# Patient Record
Sex: Female | Born: 1995 | Race: Black or African American | Hispanic: No | Marital: Single | Smoking: Never smoker
Health system: Southern US, Community
[De-identification: ages and names within clinical notes are randomized; demographics above are authoritative.]

## PROBLEM LIST (undated history)

## (undated) ENCOUNTER — Ambulatory Visit (HOSPITAL_COMMUNITY): Admission: EM | Payer: Medicaid Other | Source: Home / Self Care

## (undated) ENCOUNTER — Inpatient Hospital Stay (HOSPITAL_COMMUNITY): Payer: Self-pay

## (undated) DIAGNOSIS — A630 Anogenital (venereal) warts: Secondary | ICD-10-CM

## (undated) DIAGNOSIS — R87619 Unspecified abnormal cytological findings in specimens from cervix uteri: Secondary | ICD-10-CM

## (undated) DIAGNOSIS — I10 Essential (primary) hypertension: Secondary | ICD-10-CM

## (undated) DIAGNOSIS — O365990 Maternal care for other known or suspected poor fetal growth, unspecified trimester, not applicable or unspecified: Secondary | ICD-10-CM

## (undated) DIAGNOSIS — A749 Chlamydial infection, unspecified: Secondary | ICD-10-CM

## (undated) HISTORY — PX: WISDOM TOOTH EXTRACTION: SHX21

## (undated) HISTORY — DX: Unspecified abnormal cytological findings in specimens from cervix uteri: R87.619

---
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
## 2016-01-02 DIAGNOSIS — N760 Acute vaginitis: Secondary | ICD-10-CM | POA: Insufficient documentation

## 2016-01-02 DIAGNOSIS — B9689 Other specified bacterial agents as the cause of diseases classified elsewhere: Secondary | ICD-10-CM | POA: Insufficient documentation

## 2016-01-15 IMAGING — US US OB COMP +14 WK
1 of 2 series · 12 of 28 positions shown · non-contrast
Comparison: none

[Series 1: us ob comp +14 wk mfm · 12 of 85 slices shown]
[im 1/85]
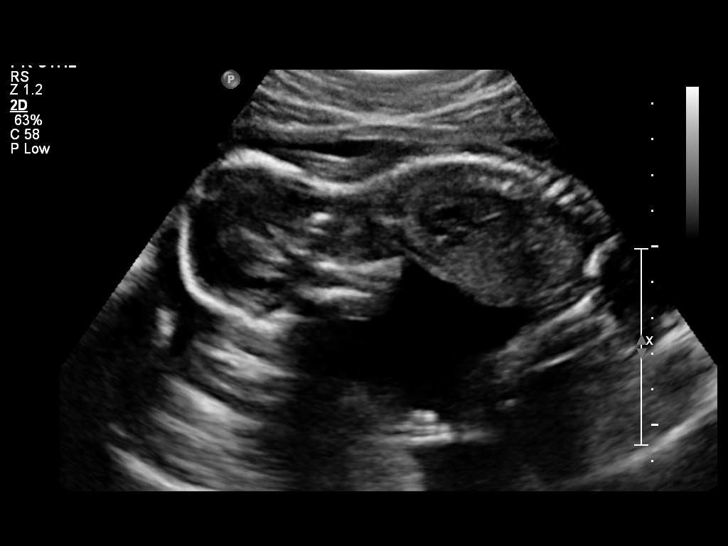
[im 7/85]
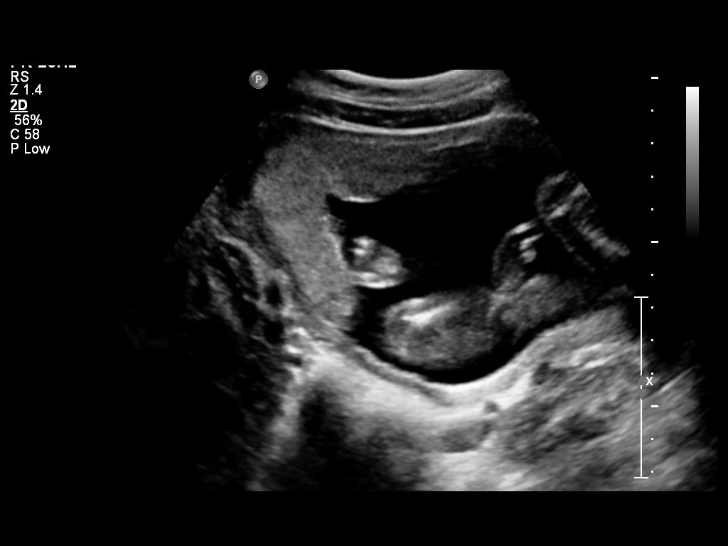
[im 13/85]
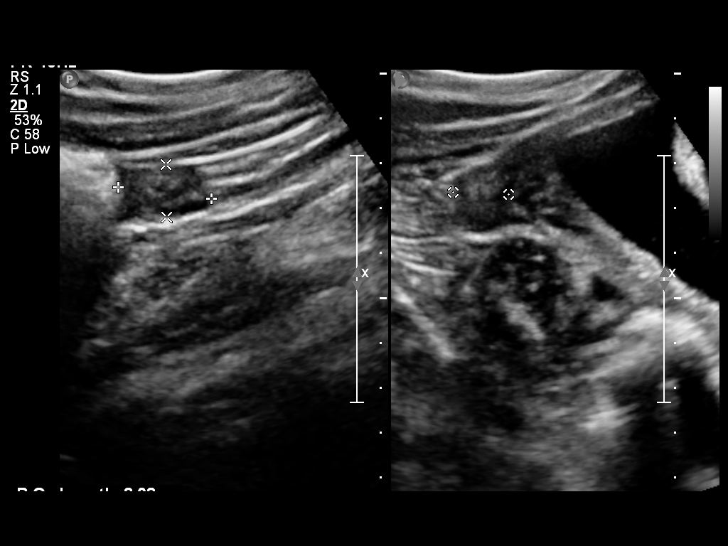
[im 23/85]
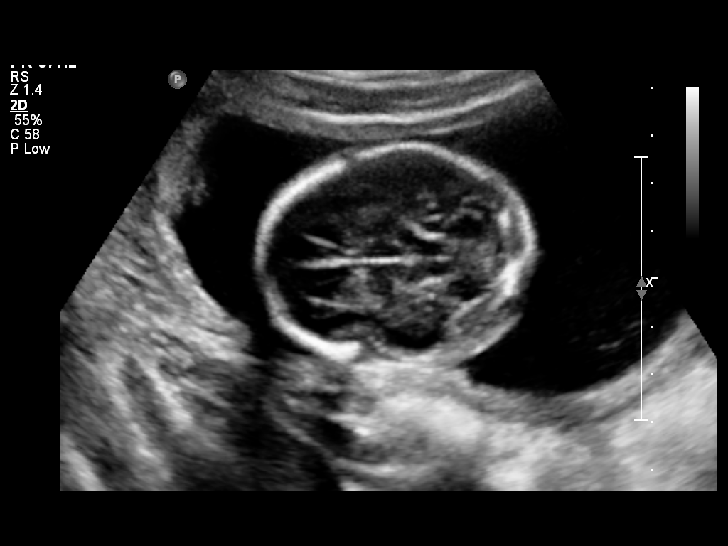
[im 30/85]
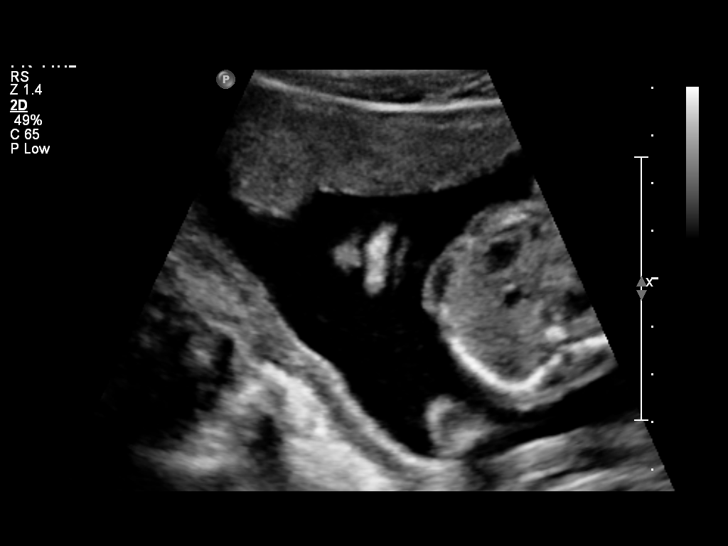
[im 36/85]
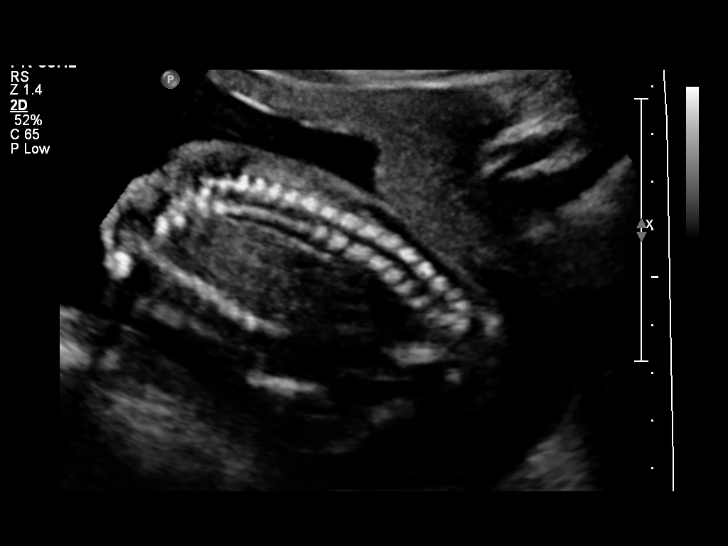
[im 46/85]
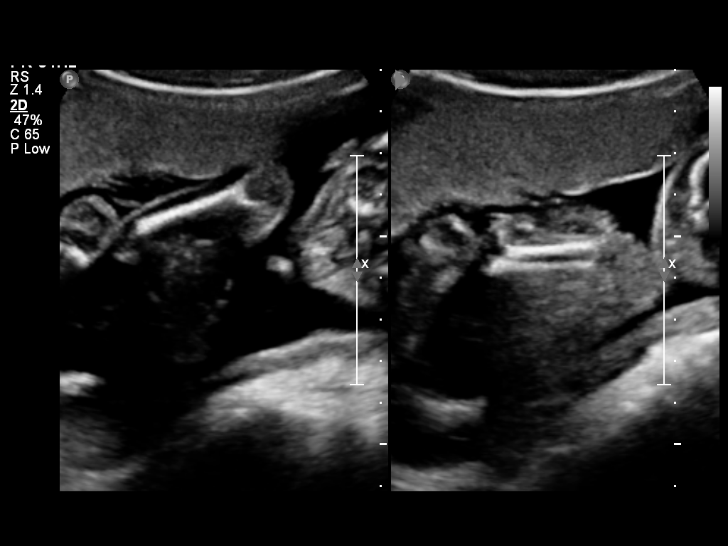
[im 52/85]
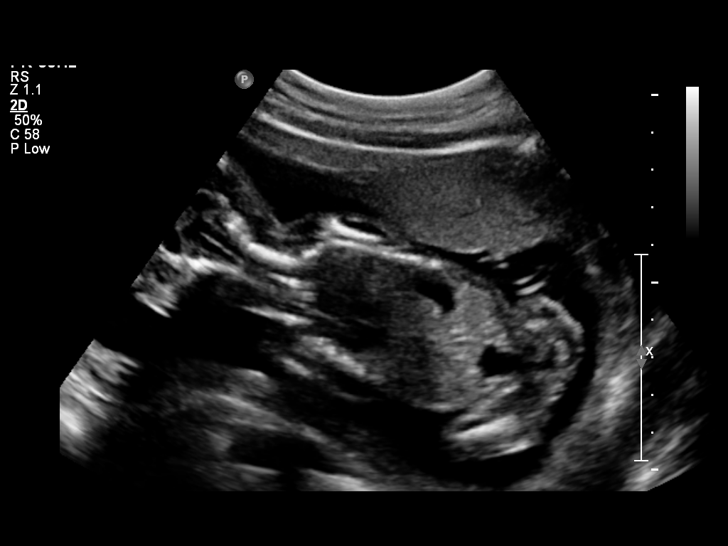
[im 59/85]
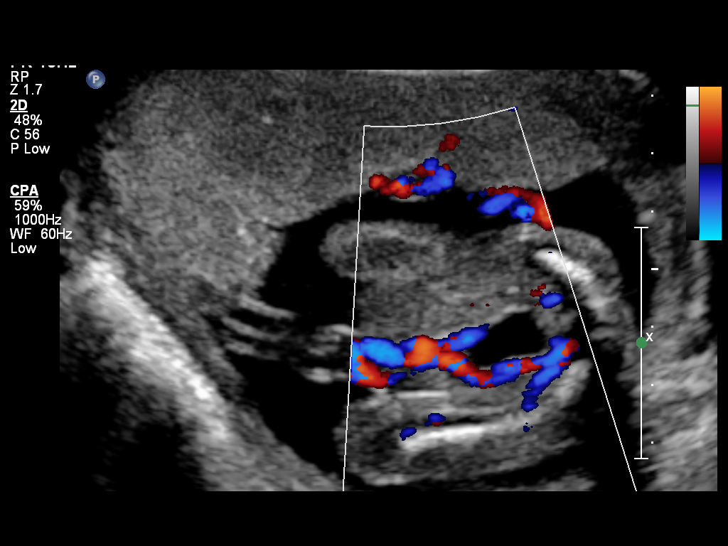
[im 68/85]
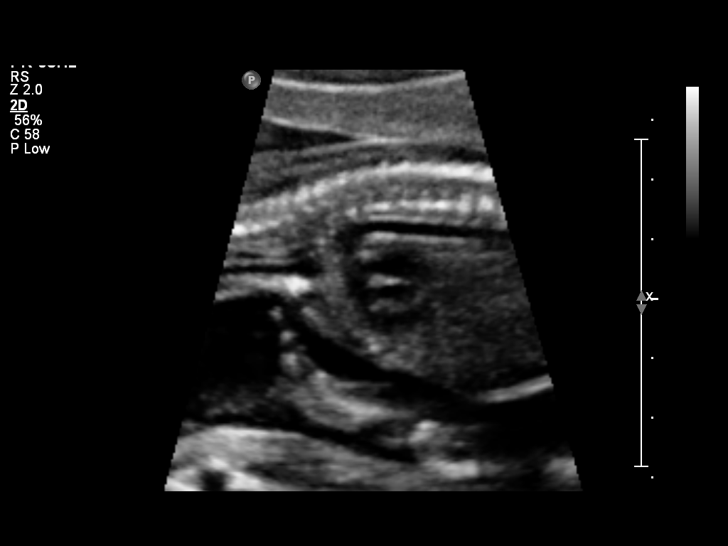
[im 75/85]
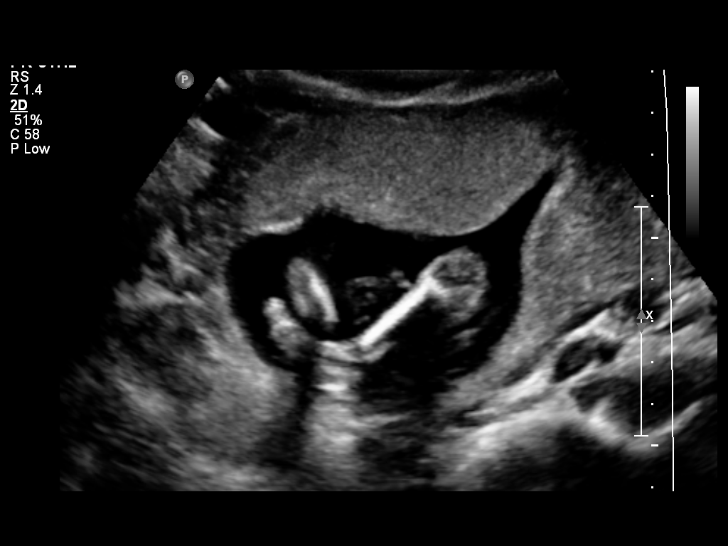
[im 81/85]
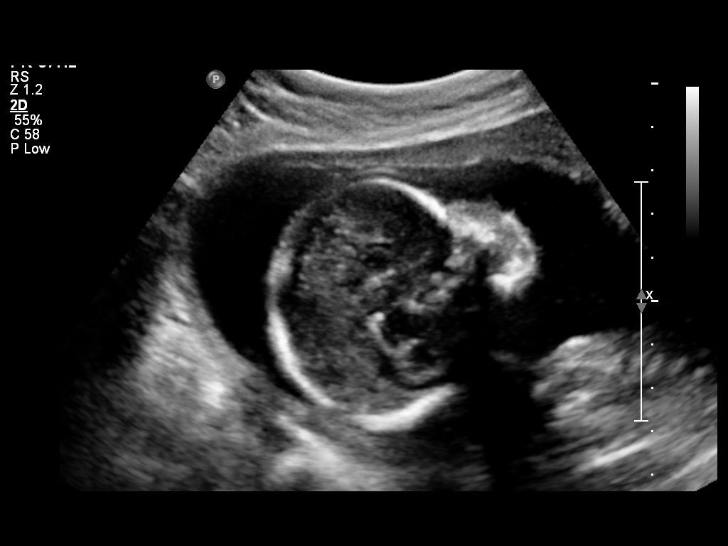

[12 of 28 positions shown; findings below may reference images not displayed]

OBSTETRICS REPORT
                      (Signed Final 10/21/2014 [DATE])

Service(s) Provided

 US OB COMP + 14 WK                                    76805.1
Indications

 Basic anatomic survey                                 z36
Fetal Evaluation

 Num Of Fetuses:    1
 Fetal Heart Rate:  143                          bpm
 Cardiac Activity:  Observed
 Presentation:      Breech
 Placenta:          Right lateral, above
                    cervical os
 P. Cord            Visualized, central
 Insertion:

 Amniotic Fluid
 AFI FV:      Subjectively within normal limits
                                             Larg Pckt:     7.6  cm
Biometry

 BPD:     44.1  mm     G. Age:  19w 2d                CI:         70.6   70 - 86
                                                      FL/HC:      16.6   16.1 -

 HC:     167.3  mm     G. Age:  19w 3d       42  %    HC/AC:      1.23   1.09 -

 AC:     135.5  mm     G. Age:  19w 0d       32  %    FL/BPD:
 FL:      27.8  mm     G. Age:  18w 4d       15  %    FL/AC:      20.5   20 - 24
 HUM:     27.6  mm     G. Age:  18w 6d       36  %
 CER:     20.1  mm     G. Age:  19w 1d       44  %
 NFT:     3.42  mm

 Est. FW:     261  gm      0 lb 9 oz     35  %
Gestational Age

 LMP:           19w 3d        Date:  06/07/14                 EDD:   03/14/15
 Clinical EDD:  19w 3d                                        EDD:   03/14/15
 U/S Today:     19w 1d                                        EDD:   03/16/15
 Best:          19w 3d     Det. By:  LMP  (06/07/14)          EDD:   03/14/15
Anatomy
 Cranium:          Appears normal         Ductal Arch:      Not well visualized
 Fetal Cavum:      Appears normal         Diaphragm:        Appears normal
 Ventricles:       Appears normal         Stomach:          Appears normal, left
                                                            sided
 Choroid Plexus:   Appears normal         Abdomen:          Appears normal
 Nuchal Fold:      Appears normal         Abdominal Wall:   Appears nml (cord
                                                            insert, abd wall)
 Face:             Appears normal         Cord Vessels:     Appears normal (3
                   (orbits and profile)                     vessel cord)
 Lips:             Appears normal         Kidneys:          Appear normal
 Heart:            Appears normal         Bladder:          Appears normal
                   (4CH, axis, and
                   situs)
 RVOT:             Not well visualized    Spine:            Appears normal
 LVOT:             Not well visualized    Lower             Appears normal
                                          Extremities:
 Aortic Arch:      Appears normal         Upper             Appears normal
                                          Extremities:

 Other:  Fetus appears to be a female. Left 5th digit visualized. Nasal bone
         visualized. Technically difficult due to fetal position.
Targeted Anatomy

 Fetal Central Nervous System
 Lat. Ventricles:  6.8                    Cisterna Magna:
Cervix Uterus Adnexa

 Cervical Length:    3.2      cm

 Cervix:       Normal appearance by transabdominal scan.
 Left Ovary:    Size(cm) L: 2.86 x W: 1.96 x H: 1.85  Volume(cc):
 Right Ovary:   Size(cm) L: 2.08 x W: 1.23 x H: 1.17  Volume(cc):

 Adnexa:     No abnormality visualized.
Impression

 Single IUP at 19w 3d
 There appears to be a defect in the inferior cerebellar vermis -
 ? Rtoyota Joshjax cyst
 The fetal cranial anatomy otherwise appears within normal
 limits
 Ultrasound measurements consistent with dates
 Right lateral placenta without previa
 Normal amniotic fluid volume
Recommendations

 Recommend follow-up ultrasound examination in 4 weeks
 with MFM to reevaluate cranial anatomy.

 questions or concerns.

## 2016-03-18 IMAGING — US US OB FOLLOW-UP
1 series · 12 of 28 positions shown · non-contrast
Comparison: none

[Series 1: us ob follow-up · 0.23mm/px · 12 of 85 slices shown]
[im 4/85]
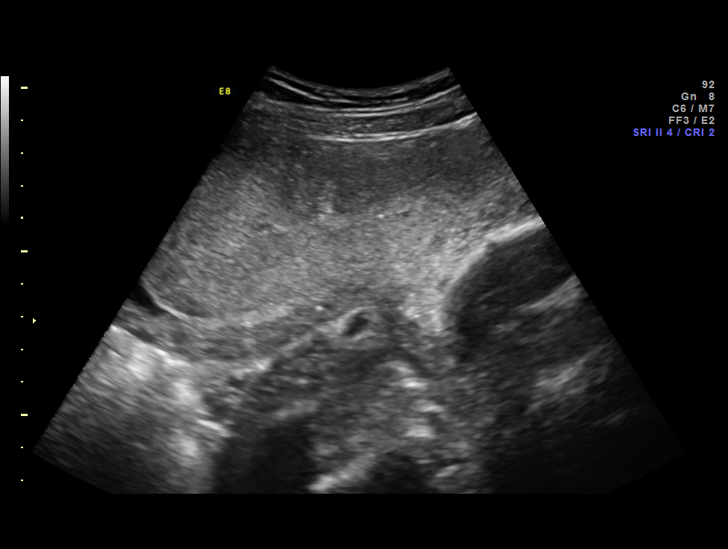
[im 10/85]
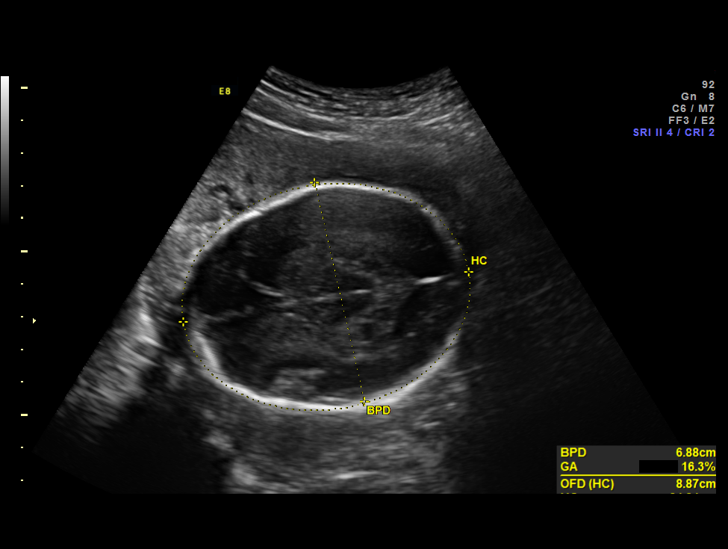
[im 16/85]
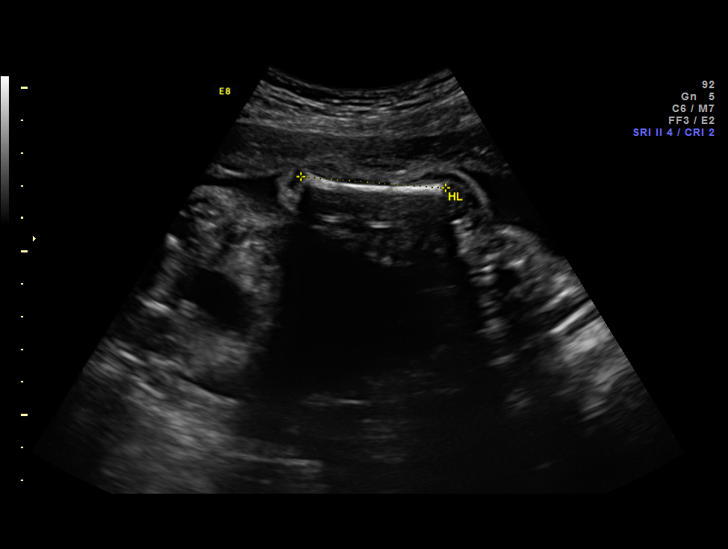
[im 25/85]
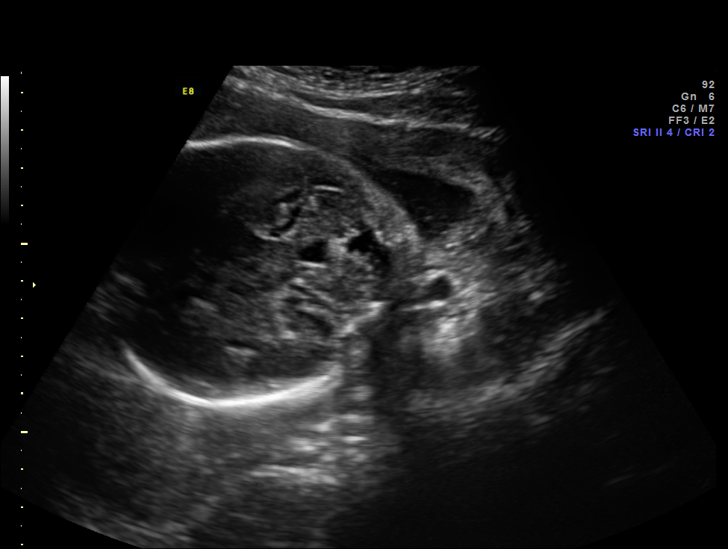
[im 32/85]
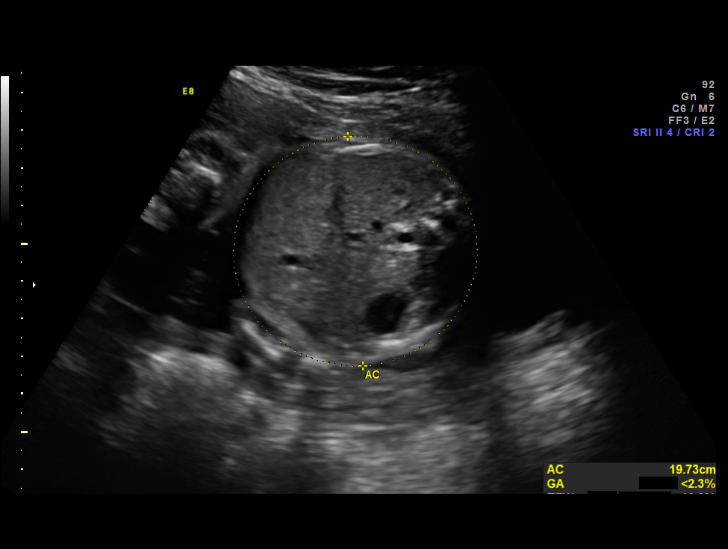
[im 38/85]
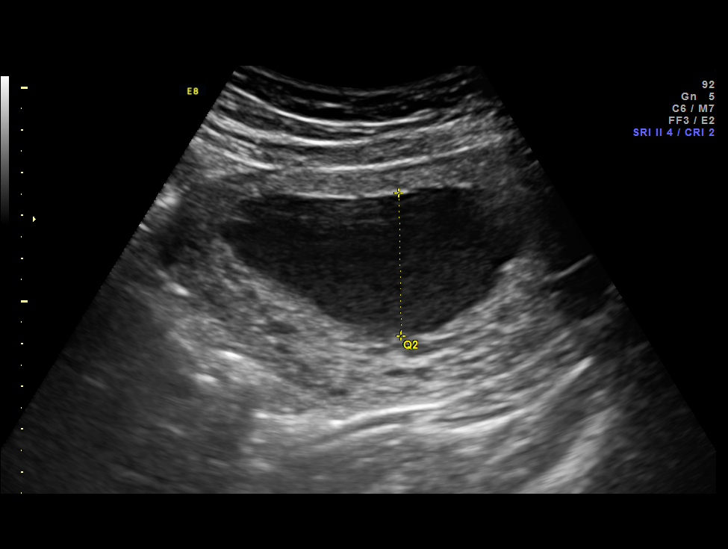
[im 47/85]
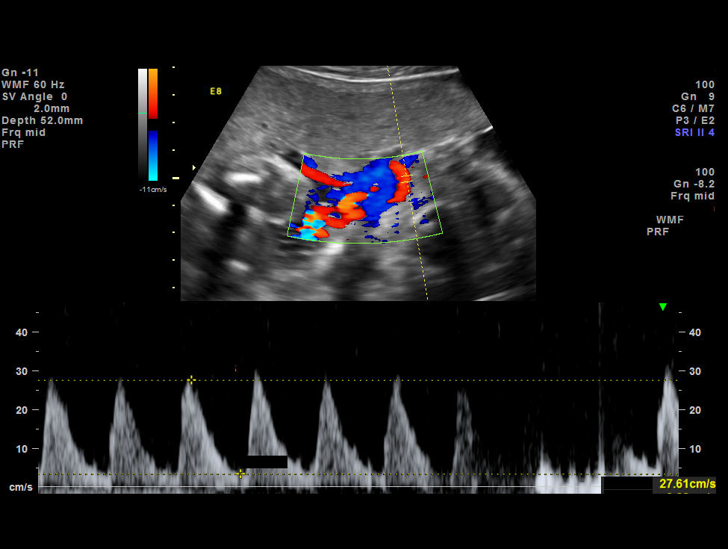
[im 53/85]
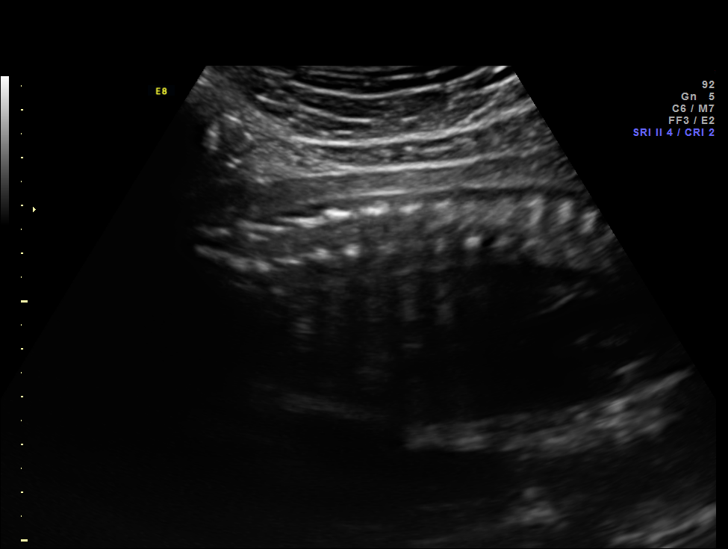
[im 60/85]
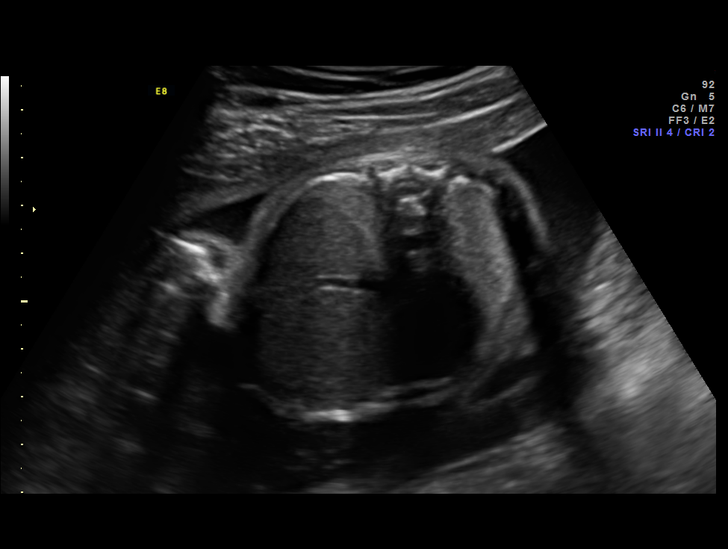
[im 69/85]
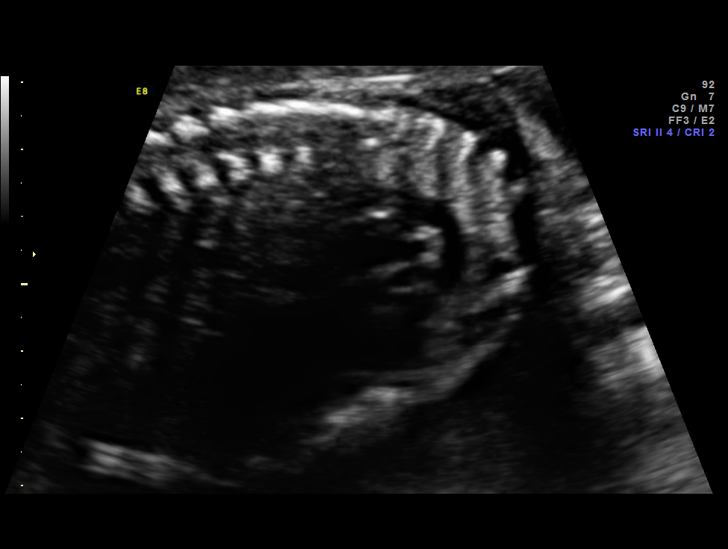
[im 75/85]
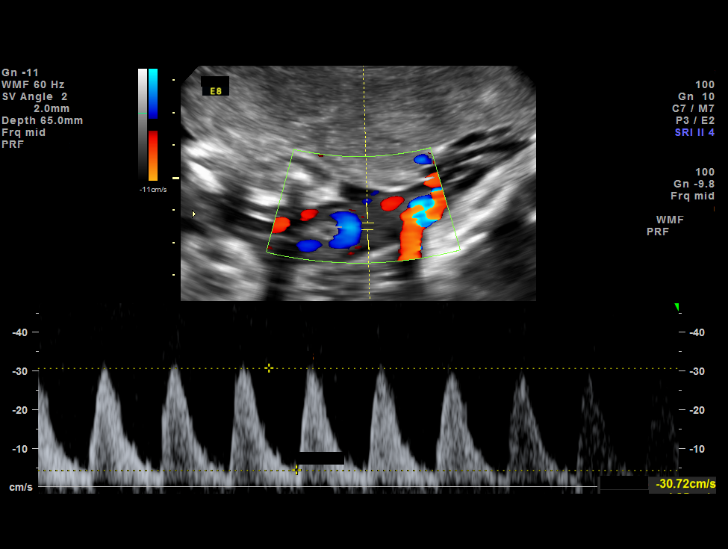
[im 81/85]
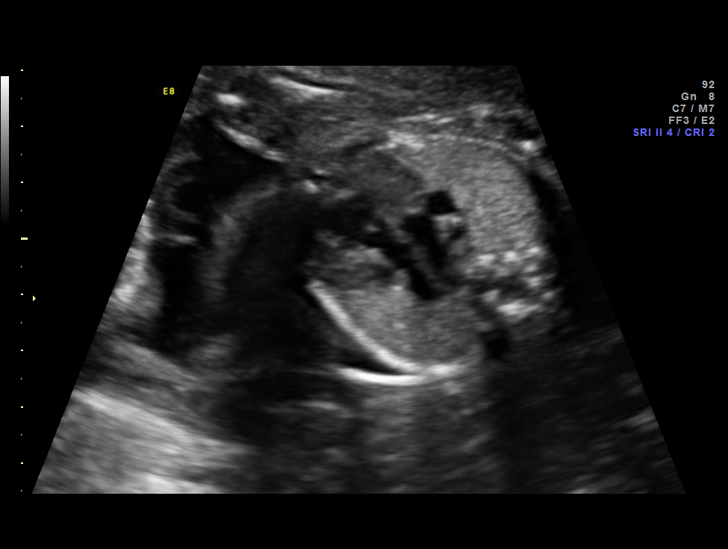

[12 of 28 positions shown; findings below may reference images not displayed]

OBSTETRICS REPORT
                      (Signed Final 12/23/2014 [DATE])

Service(s) Provided

 US OB FOLLOW UP                                       76816.1
 US UA CORD DOPPLER                                    76820.0
Indications

 Fetal abnormality - other known or suspected (post
 fossa cyst)
 Hypertension - Gestational
 Size less than dates (Small for gestational age,
 FGR)
 28 weeks gestation of pregnancy
Fetal Evaluation

 Num Of Fetuses:    1
 Fetal Heart Rate:  148                          bpm
 Cardiac Activity:  Observed
 Presentation:      Cephalic
 Placenta:          Right lateral, above
                    cervical os
 P. Cord            Previously Visualized
 Insertion:

 Amniotic Fluid
 AFI FV:      Subjectively low-normal
 AFI Sum:     7.16    cm      < 3  %Tile     Larg Pckt:    3.35  cm
 RUQ:   2.08    cm   LUQ:    3.35   cm    LLQ:   1.73    cm
Biophysical Evaluation

 Amniotic F.V:   Within normal limits       F. Tone:        Observed
 F. Movement:    Observed                   Score:          [DATE]
 F. Breathing:   Observed
Biometry

 BPD:     68.6  mm     G. Age:  27w 4d                CI:         77.9   70 - 86
 OFD:     88.1  mm                                    FL/HC:      18.9   18.8 -

 HC:     248.6  mm     G. Age:  27w 0d      < 3  %    HC/AC:      1.30   1.05 -

 AC:     191.6  mm     G. Age:  23w 6d      < 3  %    FL/BPD:     68.4   71 - 87
 FL:      46.9  mm     G. Age:  25w 5d      < 3  %    FL/AC:      24.5   20 - 24
 HUM:     43.9  mm     G. Age:  26w 0d      < 5  %
 CER:     32.3  mm     G. Age:  28w 2d       48  %
 Est. FW:     759  gm    1 lb 11 oz    < 10  %
Gestational Age

 LMP:           28w 3d        Date:  06/07/14                 EDD:   03/14/15
 Clinical EDD:  28w 3d                                        EDD:   03/14/15
 U/S Today:     26w 0d                                        EDD:   03/31/15
 Best:          28w 3d     Det. By:  LMP  (06/07/14)          EDD:   03/14/15
Anatomy

 Cranium:          Appears normal         Aortic Arch:      Appears normal
 Fetal Cavum:      Appears normal         Ductal Arch:      Appears normal
 Ventricles:       Appears normal         Diaphragm:        Appears normal
 Choroid Plexus:   Appears normal         Stomach:          Appears normal, left
                                                            sided
 Cerebellum:       Appears normal         Abdomen:          Appears normal
 Posterior Fossa:  Variant, see           Abdominal Wall:   Previously seen
                   comments
 Nuchal Fold:      Previously seen        Cord Vessels:     Appears normal (3
                                                            vessel cord)
 Face:             Orbits and profile     Kidneys:          Appear normal
                   previously seen
 Lips:             Appears normal         Bladder:          Appears normal
 Heart:            Appears normal         Spine:            Appears normal
                   (4CH, axis, and
                   situs)
 RVOT:             Appears normal         Lower             Previously seen
                                          Extremities:
 LVOT:             Appears normal         Upper             Previously seen
                                          Extremities:

 Other:  Female gender. Heels and 5th digit appear normal.
Doppler - Fetal Vessels

 Umbilical Artery
 S/D:   7.94       > 97.5  %tile       RI:
                                       PSV:       30.72   cm/s
 Umbilical Artery
 Absent DFV:    No     Reverse DFV:    No

Cervix Uterus Adnexa

 Cervical Length:    3.3      cm

 Cervix:       Normal appearance by transabdominal scan.

 Left Ovary:    Within normal limits.
 Right Ovary:   Within normal limits.
Impression

 Single IUP at 28w 3d
 Gestational hypertension, possible Akhira Nijam cyst vs.
 posterior fossa variant
 Patient previously completed a course of Ceejay
 The estimated fetal weight today is < 10th %tile (759 g)
 Elevated UA Doppler studies.  Some intermittently absent
 diastolic flow
 BPP [DATE]
 Amniotic fluid volume low normal (AFI 7.1)
 BP: 140/89
Recommendations

 Recommend follow up ultrasound for UA Doppler studies in
 48 hours.
 If persisently absent diastolic flow is noted, would
 recommend admission at that time.

 If stable, will begin 2x weekly BPPs with UA Doppler studies.
 Ultrasound for interval growth in 3 weeks.

 questions or concerns.

## 2016-03-19 IMAGING — US US UA DOPPLER RE-EVAL
1 series · 11 of 11 positions shown · non-contrast
Comparison: none

[Series 1: us ua doppler re-eval · 0.23mm/px · 11 of 11 slices shown]
[im 1/11]
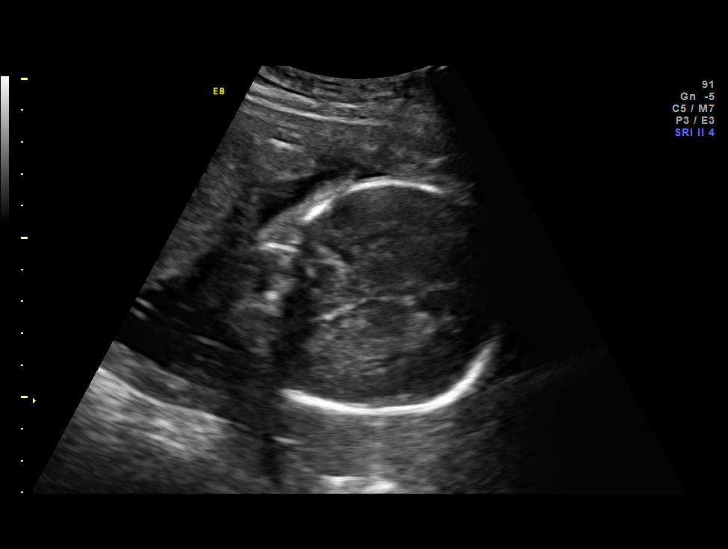
[im 2/11]
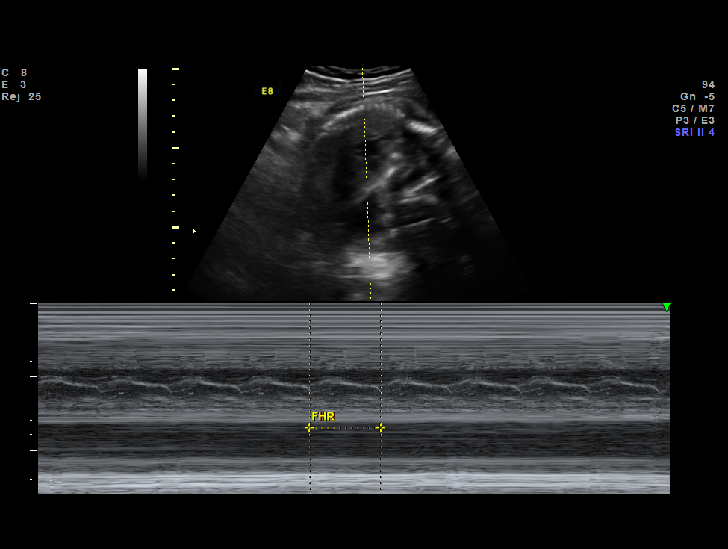
[im 3/11]
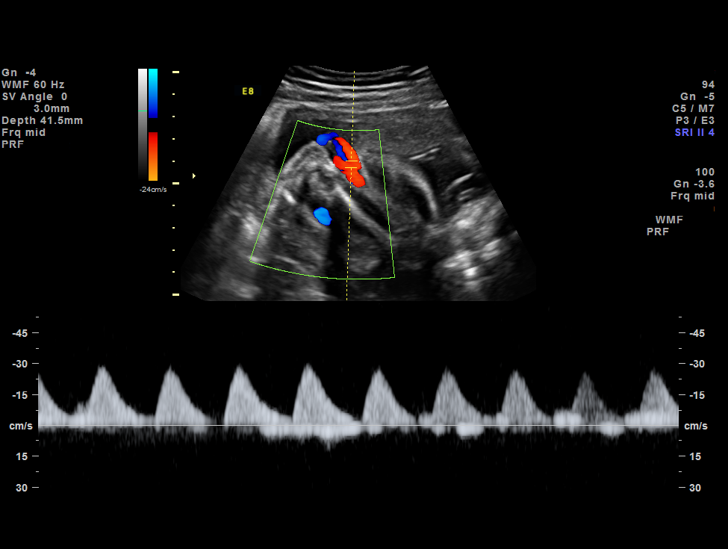
[im 4/11]
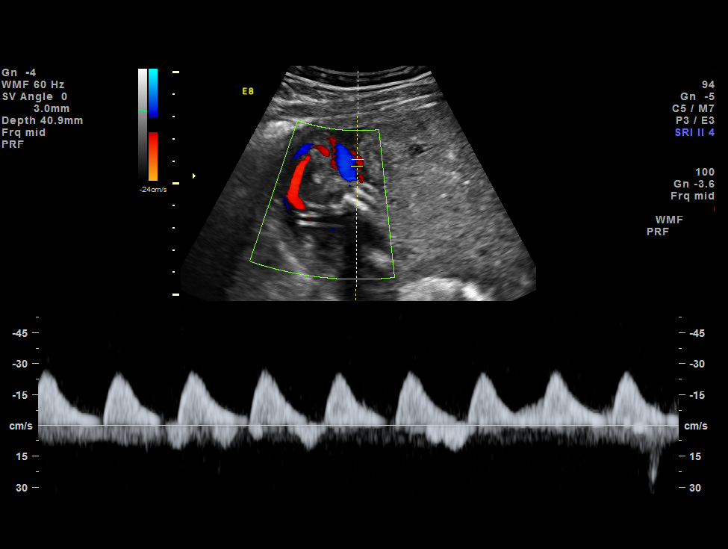
[im 5/11]
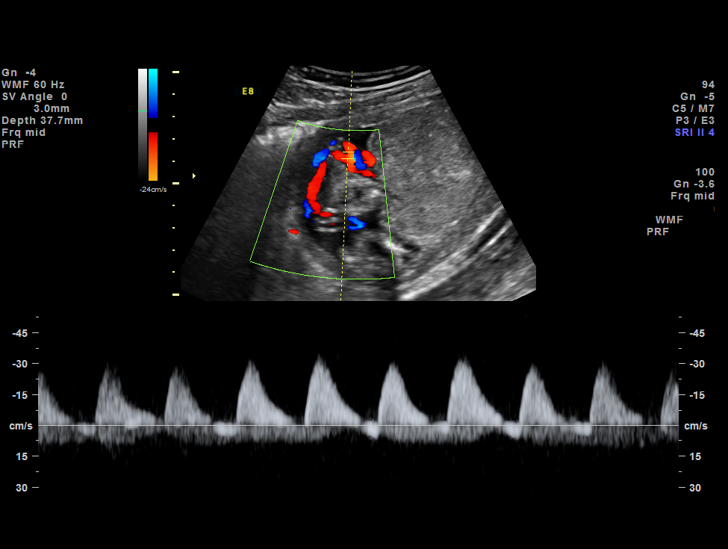
[im 6/11]
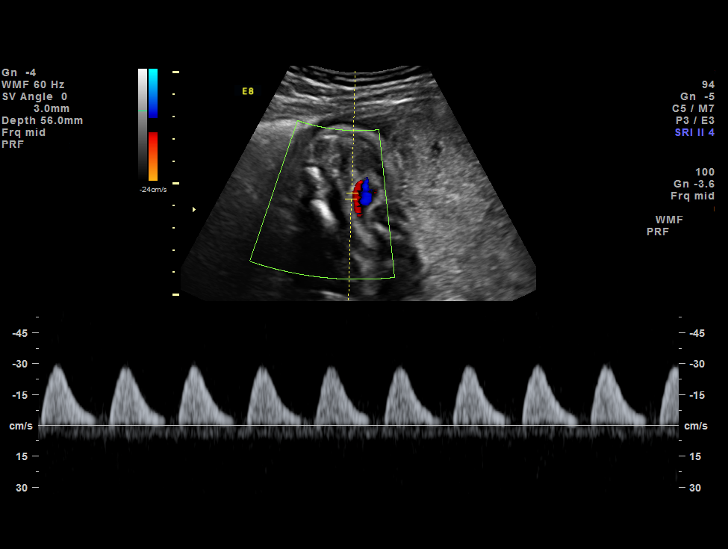
[im 7/11]
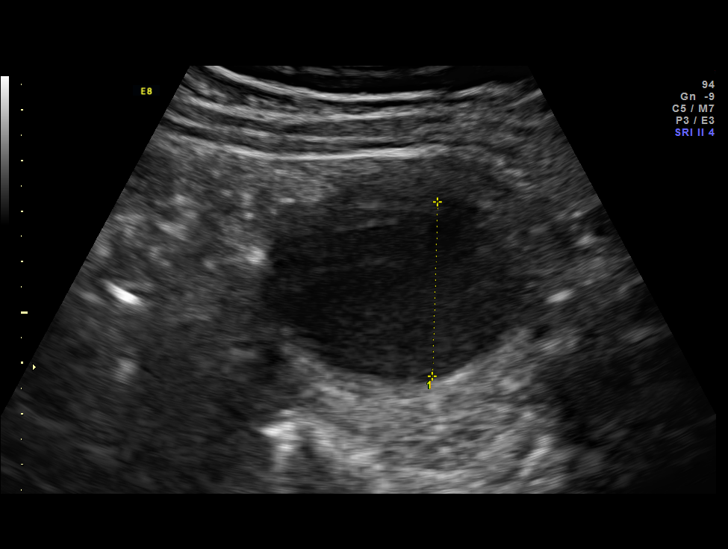
[im 8/11]
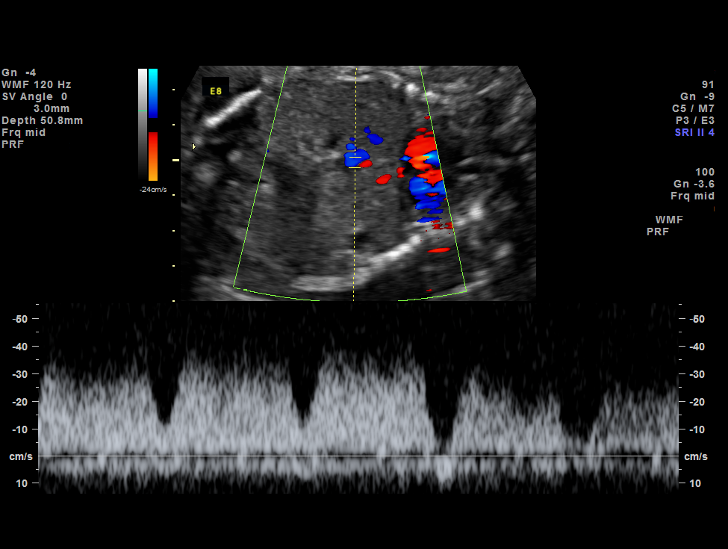
[im 9/11]
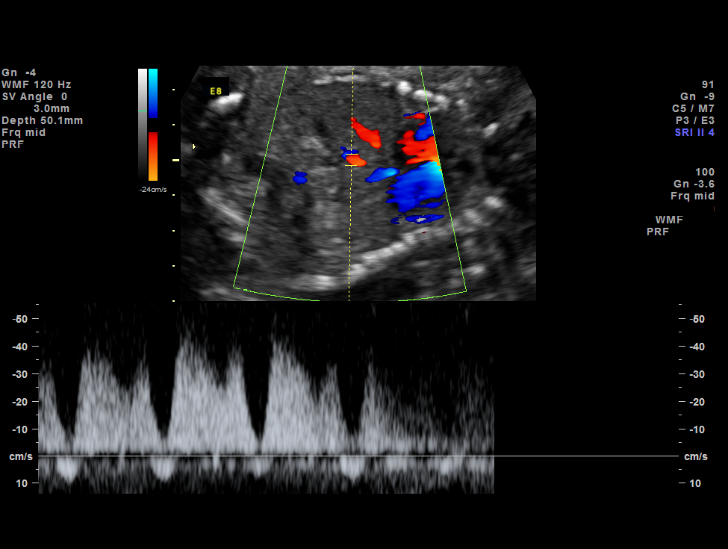
[im 10/11]
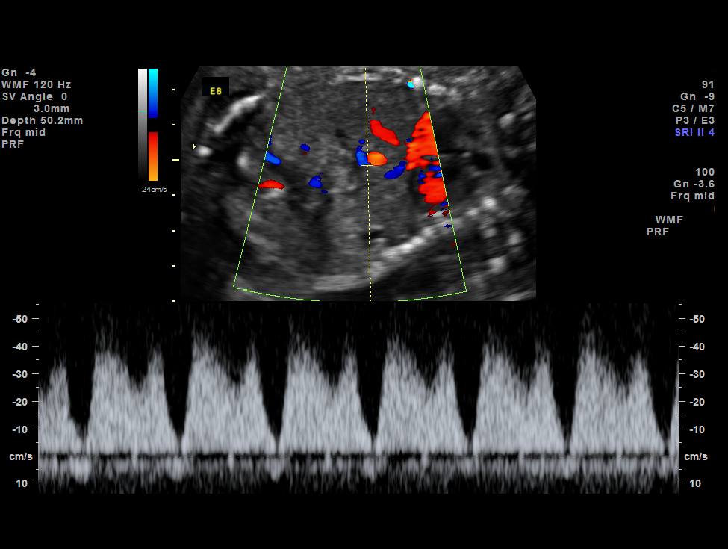
[im 11/11]
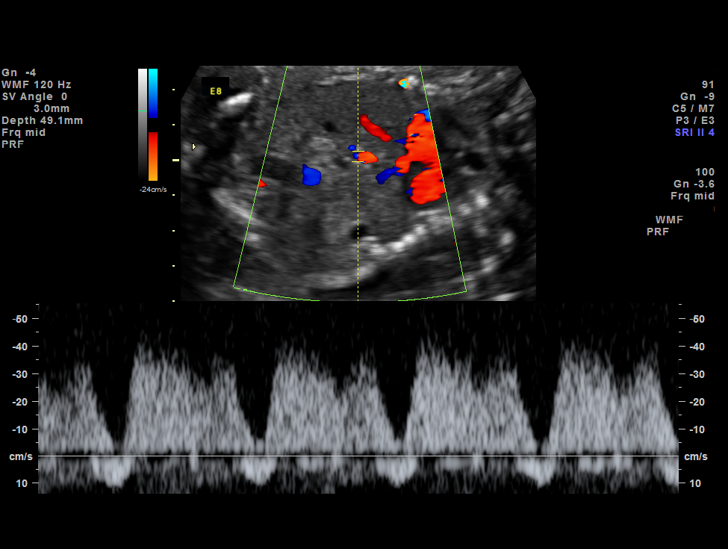

[11 of 11 positions shown; findings below may reference images not displayed]

OBSTETRICS REPORT
                      (Signed Final 12/24/2014 [DATE])

Service(s) Provided

 US UA DOPPLER RE-EVAL                                 76828.1
Indications

 Fetal abnormality - other known or suspected (post
 fossa cyst)
 Hypertension - Gestational
 Size less than dates (Small for gestational age,
 FGR)
 28 weeks gestation of pregnancy
Fetal Evaluation

 Num Of Fetuses:    1
 Fetal Heart Rate:  141                          bpm
 Cardiac Activity:  Observed
 Presentation:      Cephalic

 Amniotic Fluid
 AFI FV:      Subjectively within normal limits
Biophysical Evaluation

 Amniotic F.V:   Within normal limits       F. Tone:        Observed
 F. Movement:    Observed                   Score:          [DATE]
 F. Breathing:   Not Observed
Gestational Age

 LMP:           28w 4d        Date:  06/07/14                 EDD:   03/14/15
 Clinical EDD:  28w 4d                                        EDD:   03/14/15
 Best:          28w 4d     Det. By:  LMP  (06/07/14)          EDD:   03/14/15
Doppler - Fetal Vessels

 Umbilical Artery
 Absent DFV:    Yes    Reverse DFV:    Yes

Impression

 Single IUP at 28w 4d
 Suspected IUGR (EFW < 10th %tile yesterday), Gestational
 hypertension, possible posterior fossa abnormality/ variant
 Previously completed course of Domingos
 UA Doppler studies - AEDF and REDF
 Ductus venosus - abnormal waveform with some reversed A
 waves noted
 BPP - [DATE] (absent breathing movement noted)

 Unable to complete the full 30 minutes of observation for BPP
 - a spontaneous deceleration was noted and the patient was
 transferred to L&D for continous monitoring.
Recommendations

 Recommend admission
 Magnesium sulfate for GUANAKITO prophylaxis
 Continuous fetal monitoring

 Would have a low threshold to move toward delivery if the
 fetal tracing is nonreassuring given UA Doppler changes.  If
 stable, recommend repeating Doppler studies in the morning.
 Discussed with Dr. Honda.

 questions or concerns.

## 2017-12-25 ENCOUNTER — Emergency Department (HOSPITAL_BASED_OUTPATIENT_CLINIC_OR_DEPARTMENT_OTHER)
Admission: EM | Admit: 2017-12-25 | Discharge: 2017-12-26 | Disposition: A | Discharge status: Home / Self Care | Payer: Managed Care, Other (non HMO) | Attending: Emergency Medicine | Admitting: Emergency Medicine

## 2017-12-25 ENCOUNTER — Encounter (HOSPITAL_BASED_OUTPATIENT_CLINIC_OR_DEPARTMENT_OTHER): Payer: Self-pay | Admitting: Respiratory Therapy

## 2017-12-25 ENCOUNTER — Other Ambulatory Visit: Payer: Self-pay

## 2017-12-25 DIAGNOSIS — N390 Urinary tract infection, site not specified: Secondary | ICD-10-CM | POA: Insufficient documentation

## 2017-12-25 DIAGNOSIS — I10 Essential (primary) hypertension: Secondary | ICD-10-CM | POA: Diagnosis not present

## 2017-12-25 DIAGNOSIS — Z3202 Encounter for pregnancy test, result negative: Secondary | ICD-10-CM | POA: Insufficient documentation

## 2017-12-25 DIAGNOSIS — R1030 Lower abdominal pain, unspecified: Secondary | ICD-10-CM | POA: Diagnosis present

## 2017-12-25 LAB — URINALYSIS, MICROSCOPIC (REFLEX)

## 2017-12-25 LAB — URINALYSIS, ROUTINE W REFLEX MICROSCOPIC
GLUCOSE, UA: NEGATIVE mg/dL
Ketones, ur: 15 mg/dL — AB
NITRITE: POSITIVE — AB
PH: 6.5 (ref 5.0–8.0)
Specific Gravity, Urine: >1.03 — ABNORMAL HIGH (ref 1.005–1.030)

## 2017-12-25 LAB — PREGNANCY, URINE: PREG TEST UR: NEGATIVE

## 2017-12-25 MED ORDER — CEPHALEXIN 500 MG PO CAPS: 500.0000 mg | ORAL_CAPSULE | Freq: Two times a day (BID) | ORAL | 0 refills | Status: DC

## 2017-12-25 MED ORDER — PHENAZOPYRIDINE HCL 200 MG PO TABS: 200.0000 mg | ORAL_TABLET | Freq: Three times a day (TID) | ORAL | 0 refills | Status: DC | PRN

## 2017-12-25 MED ORDER — PHENAZOPYRIDINE HCL 100 MG PO TABS
95.0000 mg | ORAL_TABLET | Freq: Once | ORAL | Status: AC
  Administered 2017-12-26: 100 mg via ORAL
  Filled 2017-12-25: qty 1

## 2017-12-25 MED ORDER — CEPHALEXIN 250 MG PO CAPS
1000.0000 mg | ORAL_CAPSULE | Freq: Once | ORAL | Status: AC
  Administered 2017-12-26: 1000 mg via ORAL
  Filled 2017-12-25: qty 4

## 2017-12-25 NOTE — ED Triage Notes (Signed)
Pt c/o lower abdominal pain with urinary frequency, no fevers, no flank pain, no discharge and no n/v/d

## 2017-12-25 NOTE — ED Provider Notes (Signed)
[facility] Provider Note: Emily DellJ. Lane Rim Thatch, MD, FACEP  CSN: [id number] MRN: [id number] ARRIVAL: 12/25/17 at 2156 ROOM: MH01/MH01   CHIEF COMPLAINT  Abdominal Pain   HISTORY OF PRESENT ILLNESS  12/25/17 11:42 PM Emily Poole Humm is a 22 y.o. female with a 2-3-day history of suprapubic abdominal pain along with dysuria, frequent urination, and a sense of incomplete voiding.  She rates her pain as a 10 out of 10.  She denies associated nausea, vomiting, diarrhea, fever or chills.  She is having some low back pain.  She is also having some vaginal bleeding; she is not sure if this is her menses or breakthrough bleeding due to her contraceptive.   Past Medical History:  Diagnosis Date  . Cervical condyloma   . Chlamydia   . Hypertension   . Pregnancy affected by intrauterine growth retardation (IUGR)     Past Surgical History:  Procedure Laterality Date  . CESAREAN SECTION N/A 12/25/2014   Procedure: CESAREAN SECTION;  Surgeon: Lesly DukesKelly H Leggett, MD;  Location: [facility];  Service: Obstetrics;  Laterality: N/A;  . WISDOM TOOTH EXTRACTION      Family History  Problem Relation Age of Onset  . Diabetes Father     Social History   Tobacco Use  . Smoking status: Never Smoker  Substance Use Topics  . Alcohol use: No  . Drug use: No    Prior to Admission medications   Not on File    Allergies Patient has no known allergies.   REVIEW OF SYSTEMS  Negative except as noted here or in the History of Present Illness.   PHYSICAL EXAMINATION  Initial Vital Signs Blood pressure 111/60, pulse 72, temperature 98.3 F (36.8 C), temperature source Oral, resp. rate 16, height 5\' 5"  (1.651 m), weight 62.6 kg (138 lb), last menstrual period 11/24/2017, SpO2 100 %, currently breastfeeding.  Examination General: Well-developed, well-nourished female in no acute distress; appearance consistent with age of record HENT: normocephalic; atraumatic Eyes: pupils equal, round and  reactive to light; extraocular muscles intact Neck: supple Heart: regular rate and rhythm; no murmurs, rubs or gallops Lungs: clear to auscultation bilaterally Abdomen: soft; nondistended; nontender; no masses or hepatosplenomegaly; bowel sounds present GU: No CVA tenderness Extremities: No deformity; full range of motion; pulses normal Neurologic: Awake, alert and oriented; motor function intact in all extremities and symmetric; no facial droop Skin: Warm and dry Psychiatric: Normal mood and affect   RESULTS  Summary of this visit's results, reviewed by myself:   EKG Interpretation  Date/Time:    Ventricular Rate:    PR Interval:    QRS Duration:   QT Interval:    QTC Calculation:   R Axis:     Text Interpretation:        Laboratory Studies: Results for orders placed or performed during the hospital encounter of 12/25/17 (from the past 24 hour(s))  Urinalysis, Routine w reflex microscopic     Status: Abnormal   Collection Time: 12/25/17  9:57 PM  Result Value Ref Range   Color, Urine RED (A) YELLOW   APPearance CLOUDY (A) CLEAR   Specific Gravity, Urine >1.030 (H) 1.005 - 1.030   pH 6.5 5.0 - 8.0   Glucose, UA NEGATIVE NEGATIVE mg/dL   Hgb urine dipstick LARGE (A) NEGATIVE   Bilirubin Urine MODERATE (A) NEGATIVE   Ketones, ur 15 (A) NEGATIVE mg/dL   Protein, ur >782>300 (A) NEGATIVE mg/dL   Nitrite POSITIVE (A) NEGATIVE   Leukocytes, UA LARGE (  A) NEGATIVE  Pregnancy, urine     Status: None   Collection Time: 12/25/17  9:57 PM  Result Value Ref Range   Preg Test, Ur NEGATIVE NEGATIVE  Urinalysis, Microscopic (reflex)     Status: Abnormal   Collection Time: 12/25/17  9:57 PM  Result Value Ref Range   RBC / HPF TOO NUMEROUS TO COUNT 0 - 5 RBC/hpf   WBC, UA TOO NUMEROUS TO COUNT 0 - 5 WBC/hpf   Bacteria, UA MANY (A) NONE SEEN   Squamous Epithelial / LPF 0-5 (A) NONE SEEN   Urine-Other LESS THAN 10 mL OF URINE SUBMITTED    Imaging Studies: No results found.  ED  COURSE  Nursing notes and initial vitals signs, including pulse oximetry, reviewed.  Vitals:   12/25/17 2201 12/25/17 2202  BP: 111/60   Pulse: 72   Resp: 16   Temp: 98.3 F (36.8 C)   TempSrc: Oral   SpO2: 100%   Weight:  62.6 kg (138 lb)  Height:  5\' 5"  (1.651 m)    PROCEDURES    ED DIAGNOSES     ICD-10-CM   1. Lower urinary tract infectious disease N39.0        Bert Ptacek, MD 12/25/17 2352

## 2017-12-27 LAB — URINE CULTURE

## 2018-02-11 ENCOUNTER — Ambulatory Visit (INDEPENDENT_AMBULATORY_CARE_PROVIDER_SITE_OTHER): Payer: Managed Care, Other (non HMO)

## 2018-02-11 VITALS — BP 106/63 | HR 67 | Resp 16 | Ht 63.5 in | Wt 139.0 lb

## 2018-02-11 DIAGNOSIS — Z3046 Encounter for surveillance of implantable subdermal contraceptive: Secondary | ICD-10-CM

## 2018-02-11 DIAGNOSIS — Z3202 Encounter for pregnancy test, result negative: Secondary | ICD-10-CM | POA: Diagnosis not present

## 2018-02-11 DIAGNOSIS — Z30017 Encounter for initial prescription of implantable subdermal contraceptive: Secondary | ICD-10-CM

## 2018-02-11 LAB — POCT URINE PREGNANCY: Preg Test, Ur: NEGATIVE

## 2018-02-11 MED ORDER — ETONOGESTREL 68 MG ~~LOC~~ IMPL
68.0000 mg | DRUG_IMPLANT | Freq: Once | SUBCUTANEOUS | Status: AC
  Administered 2018-02-11: 68 mg via SUBCUTANEOUS

## 2018-02-11 NOTE — Progress Notes (Signed)
     GYNECOLOGY OFFICE PROCEDURE NOTE  Emily Poole is a 22 y.o. G1P0101 here for Nexplanon removal and reinsertion. No other gynecologic concerns.  Nexplanon Removal and Insertion  Patient identified, informed consent performed, consent signed.   Patient does understand that irregular bleeding is a very common side effect of this medication. She was advised to have backup contraception for one week after replacement of the implant. Pregnancy test in clinic today was negative.  Appropriate time out taken. Implanon site identified. Area prepped in usual sterile fashon. One ml of 1% lidocaine was used to anesthetize the area at the distal end of the implant. A small stab incision was made right beside the implant on the distal portion. The Nexplanon rod was grasped using hemostats and removed without difficulty. There was minimal blood loss. There were no complications. Area was then injected with 3 ml of 1 % lidocaine. She was re-prepped with betadine, Nexplanon removed from packaging, Device confirmed in needle, then inserted full length of needle and withdrawn per handbook instructions. Nexplanon was able to palpated in the patient's arm; patient palpated the insert herself.  There was minimal blood loss. Patient insertion site covered with guaze and a pressure bandage to reduce any bruising. The patient tolerated the procedure well and was given post procedure instructions.  She was advised to have backup contraception for one week.    Emily Poole, CNM 02/11/18 8:57 AM

## 2018-02-12 ENCOUNTER — Ambulatory Visit: Payer: Managed Care, Other (non HMO) | Admitting: Obstetrics and Gynecology

## 2018-03-05 ENCOUNTER — Emergency Department (HOSPITAL_BASED_OUTPATIENT_CLINIC_OR_DEPARTMENT_OTHER)
Admission: EM | Admit: 2018-03-05 | Discharge: 2018-03-05 | Disposition: A | Discharge status: Home / Self Care | Payer: Medicaid Other | Attending: Emergency Medicine | Admitting: Emergency Medicine

## 2018-03-05 ENCOUNTER — Other Ambulatory Visit: Payer: Self-pay

## 2018-03-05 ENCOUNTER — Encounter (HOSPITAL_BASED_OUTPATIENT_CLINIC_OR_DEPARTMENT_OTHER): Payer: Self-pay | Admitting: Emergency Medicine

## 2018-03-05 DIAGNOSIS — J039 Acute tonsillitis, unspecified: Secondary | ICD-10-CM | POA: Diagnosis not present

## 2018-03-05 DIAGNOSIS — I10 Essential (primary) hypertension: Secondary | ICD-10-CM | POA: Insufficient documentation

## 2018-03-05 DIAGNOSIS — R070 Pain in throat: Secondary | ICD-10-CM | POA: Diagnosis present

## 2018-03-05 MED ORDER — PENICILLIN G BENZATHINE 1200000 UNIT/2ML IM SUSP
1.2000 10*6.[IU] | Freq: Once | INTRAMUSCULAR | Status: AC
  Administered 2018-03-05: 1.2 10*6.[IU] via INTRAMUSCULAR
  Filled 2018-03-05: qty 2

## 2018-03-05 NOTE — Discharge Instructions (Addendum)
You may take Tylenol and/or ibuprofen as needed for pain.

## 2018-03-05 NOTE — ED Triage Notes (Signed)
Sore throat since last night.  

## 2018-03-05 NOTE — ED Provider Notes (Signed)
[facility] EMERGENCY DEPARTMENT Provider Note   CSN: [id number] Arrival date & time: 03/05/18  1008     History   Chief Complaint Chief Complaint  Patient presents with  . Sore Throat    HPI Emily Poole is a 22 y.o. female.  HPI Patient presents with sore throat starting yesterday.  States the pain is worse with swallowing.  No fever or chills.  Mild nasal congestion.  No difficulty breathing.  No cough or shortness of breath.  Lives with sister who has had sore throat for the past 6 days. Past Medical History:  Diagnosis Date  . Cervical condyloma   . Chlamydia   . Hypertension   . Pregnancy affected by intrauterine growth retardation (IUGR)     Patient Active Problem List   Diagnosis Date Noted  . IUGR (intrauterine growth restriction) 12/24/2014  . Hypertension affecting pregnancy in second trimester, antepartum   . Small for gestational age fetus   . IUGR (intrauterine growth restriction) affecting care of mother   . [redacted] weeks gestation of pregnancy   . Gestational hypertension 12/14/2014  . 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 Suspected fetal anomaly, antepartum   . 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 Supervision of normal first teen pregnancy 08/24/2014    Past Surgical History:  Procedure Laterality Date  . CESAREAN SECTION N/A 12/25/2014   Procedure: CESAREAN SECTION;  Surgeon: Lesly Dukes, MD;  Location: [facility];  Service: Obstetrics;  Laterality: N/A;  . WISDOM TOOTH EXTRACTION       OB History    Gravida  1   Para  1   Term      Preterm  1   AB      Living  1     SAB      TAB      Ectopic      Multiple  0   Live Births  1            Home Medications    Prior to Admission medications   Not on File    Family History Family History  Problem Relation Age of Onset  . Diabetes Father     Social History Social History   Tobacco Use  . Smoking status: Never Smoker  . Smokeless tobacco: Never Used  Substance Use  Topics  . Alcohol use: No  . Drug use: No     Allergies   Patient has no known allergies.   Review of Systems Review of Systems  Constitutional: Negative for chills and fever.  HENT: Positive for congestion and sore throat. Negative for ear pain and sinus pressure.   Eyes: Negative for visual disturbance.  Respiratory: Negative for cough and shortness of breath.   Gastrointestinal: Negative for abdominal pain, nausea and vomiting.  Musculoskeletal: Positive for neck pain. Negative for arthralgias, back pain and neck stiffness.  Skin: Negative for rash.  Neurological: Negative for weakness, numbness and headaches.  All other systems reviewed and are negative.    Physical Exam Updated Vital Signs BP (!) 148/82 (BP Location: Right Arm)   Pulse 84   Temp 99 F (37.2 C) (Oral)   Resp 16   Ht 5' 4.5" (1.638 m)   Wt 57.6 kg (127 lb)   SpO2 100%   BMI 21.46 kg/m   Physical Exam  Constitutional: She is oriented to person, place, and time. She appears well-developed and well-nourished. No distress.  HENT:  Head: Normocephalic and atraumatic.  Mouth/Throat: Oropharyngeal exudate  present.  Atrophy and erythema right greater than left.  Uvula is midline.  Appears to have some exudates on the right tonsil.  Mild nasal mucosal edema.  No sinus tenderness to percussion.  Eyes: Pupils are equal, round, and reactive to light. EOM are normal.  Neck: Normal range of motion. Neck supple.  Right anterior cervical lymphadenopathy.  No meningismus.  No stridor.  Cardiovascular: Normal rate and regular rhythm.  Pulmonary/Chest: Effort normal and breath sounds normal.  Abdominal: Soft. Bowel sounds are normal. There is no tenderness. There is no rebound and no guarding.  Musculoskeletal: Normal range of motion. She exhibits no edema or tenderness.  Lymphadenopathy:    She has cervical adenopathy.  Neurological: She is alert and oriented to person, place, and time.  Skin: Skin is warm and  dry. No rash noted. She is not diaphoretic. No erythema.  Psychiatric: She has a normal mood and affect. Her behavior is normal.  Nursing note and vitals reviewed.    ED Treatments / Results  Labs (all labs ordered are listed, but only abnormal results are displayed) Labs Reviewed - No data to display  EKG None  Radiology No results found.  Procedures Procedures (including critical care time)  Medications Ordered in ED Medications  penicillin g benzathine (BICILLIN LA) 1200000 UNIT/2ML injection 1.2 Million Units (1.2 Million Units Intramuscular Given 03/05/18 1054)     Initial Impression / Assessment and Plan / ED Course  I have reviewed the triage vital signs and the nursing notes.  Pertinent labs & imaging results that were available during my care of the patient were reviewed by me and considered in my medical decision making (see chart for details).     Patient appears to clinically have strep tonsillitis.  We will treat with IM penicillin.  Advised ibuprofen and Tylenol as needed for symptom control.  Return precautions have been given.  Final Clinical Impressions(s) / ED Diagnoses   Final diagnoses:  Tonsillitis    ED Discharge Orders    None       Loren RacerYelverton, Sankalp Ferrell, MD 03/05/18 1100

## 2018-06-12 ENCOUNTER — Encounter (HOSPITAL_COMMUNITY): Payer: Self-pay | Admitting: *Deleted

## 2018-06-12 ENCOUNTER — Other Ambulatory Visit: Payer: Self-pay

## 2018-06-12 ENCOUNTER — Emergency Department (HOSPITAL_COMMUNITY)
Admission: EM | Admit: 2018-06-12 | Discharge: 2018-06-12 | Disposition: A | Discharge status: Home / Self Care | Payer: Medicaid Other | Attending: Emergency Medicine | Admitting: Emergency Medicine

## 2018-06-12 DIAGNOSIS — B3731 Acute candidiasis of vulva and vagina: Secondary | ICD-10-CM

## 2018-06-12 DIAGNOSIS — B373 Candidiasis of vulva and vagina: Secondary | ICD-10-CM | POA: Insufficient documentation

## 2018-06-12 DIAGNOSIS — R39198 Other difficulties with micturition: Secondary | ICD-10-CM | POA: Diagnosis not present

## 2018-06-12 DIAGNOSIS — N898 Other specified noninflammatory disorders of vagina: Secondary | ICD-10-CM | POA: Diagnosis present

## 2018-06-12 DIAGNOSIS — I10 Essential (primary) hypertension: Secondary | ICD-10-CM | POA: Insufficient documentation

## 2018-06-12 LAB — URINALYSIS, ROUTINE W REFLEX MICROSCOPIC
BILIRUBIN URINE: NEGATIVE
Glucose, UA: NEGATIVE mg/dL
Hgb urine dipstick: NEGATIVE
Ketones, ur: NEGATIVE mg/dL
Nitrite: NEGATIVE
Protein, ur: NEGATIVE mg/dL
SPECIFIC GRAVITY, URINE: 1.026 (ref 1.005–1.030)
pH: 6 (ref 5.0–8.0)

## 2018-06-12 LAB — WET PREP, GENITAL
Clue Cells Wet Prep HPF POC: NONE SEEN
Sperm: NONE SEEN
Trich, Wet Prep: NONE SEEN

## 2018-06-12 LAB — POC URINE PREG, ED: Preg Test, Ur: NEGATIVE

## 2018-06-12 MED ORDER — FLUCONAZOLE 150 MG PO TABS: 150.0000 mg | ORAL_TABLET | Freq: Once | ORAL | 0 refills | Status: AC

## 2018-06-12 MED ORDER — AZITHROMYCIN 1 G PO PACK
1.0000 g | PACK | Freq: Once | ORAL | Status: AC
  Administered 2018-06-12: 1 g via ORAL
  Filled 2018-06-12: qty 1

## 2018-06-12 MED ORDER — LIDOCAINE HCL (PF) 1 % IJ SOLN
0.9000 mL | Freq: Once | INTRAMUSCULAR | Status: AC
  Administered 2018-06-12: 0.9 mL
  Filled 2018-06-12: qty 30

## 2018-06-12 MED ORDER — CEFTRIAXONE SODIUM 250 MG IJ SOLR
250.0000 mg | Freq: Once | INTRAMUSCULAR | Status: AC
  Administered 2018-06-12: 250 mg via INTRAMUSCULAR
  Filled 2018-06-12: qty 250

## 2018-06-12 NOTE — ED Triage Notes (Signed)
Pt sts was diagnosed with bacterial vaginosis 2 weeks ago and prescribed medications for it, however she has now developed "really, really bad" vaginal discharged and itching, to the point where she is bleeding from scratching i

## 2018-06-12 NOTE — ED Notes (Signed)
Pt now sts she  thinks her symptoms are from UTI and wants to have UA done, pt crying c/o vaginal irritation.

## 2018-06-12 NOTE — Discharge Instructions (Addendum)
Take Diflucan as prescribed.  Use probiotics as discussed.  Contact emergency room in the next 5 days for your results.

## 2018-06-12 NOTE — ED Notes (Signed)
Assisted provider in pelvic exam 

## 2018-06-12 NOTE — ED Notes (Signed)
Bed: WTR6 Expected date:  Expected time:  Means of arrival:  Comments: 

## 2018-06-12 NOTE — ED Provider Notes (Signed)
[facility]-EMERGENCY DEPT Provider Note   CSN: [id number] Arrival date & time: 06/12/18  1033     History   Chief Complaint Chief Complaint  Patient presents with  . Vaginal Discharge    HPI Emily Poole is a 22 y.o. female.  22 year old female presents with complaint of vaginal discharge with itching without odor.  Patient states she was seen 2 weeks ago and diagnosed with bacterial vaginosis, completed her course of medication however symptoms have gotten worse.  States that she always gets yeast infections with antibiotics however the health department would not give her prophylactic Diflucan.  Patient has not taken anything for her symptoms.  Patient has history of previous chlamydia infection.  Denies abdominal pain, fevers, vomiting.  No other complaints or concerns.     Past Medical History:  Diagnosis Date  . Cervical condyloma   . Chlamydia   . Hypertension   . Pregnancy affected by intrauterine growth retardation (IUGR)     Patient Active Problem List   Diagnosis Date Noted  . IUGR (intrauterine growth restriction) 12/24/2014  . Hypertension affecting pregnancy in second trimester, antepartum   . Small for gestational age fetus   . IUGR (intrauterine growth restriction) affecting care of mother   . [redacted] weeks gestation of pregnancy   . Gestational hypertension 12/14/2014  . 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 Suspected fetal anomaly, antepartum   . 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 Supervision of normal first teen pregnancy 08/24/2014    Past Surgical History:  Procedure Laterality Date  . CESAREAN SECTION N/A 12/25/2014   Procedure: CESAREAN SECTION;  Surgeon: Lesly Dukes, MD;  Location: [facility];  Service: Obstetrics;  Laterality: N/A;  . WISDOM TOOTH EXTRACTION       OB History    Gravida  1   Para  1   Term      Preterm  1   AB      Living  1     SAB      TAB      Ectopic      Multiple  0   Live Births  1              Home Medications    Prior to Admission medications   Medication Sig Start Date End Date Taking? Authorizing Provider  fluconazole (DIFLUCAN) 150 MG tablet Take 1 tablet (150 mg total) by mouth once for 1 dose. Repeat dose in 1 week if symptoms continue 06/12/18 06/12/18  Jeannie Fend, PA-C    Family History Family History  Problem Relation Age of Onset  . Diabetes Father     Social History Social History   Tobacco Use  . Smoking status: Never Smoker  . Smokeless tobacco: Never Used  Substance Use Topics  . Alcohol use: No  . Drug use: No     Allergies   Patient has no known allergies.   Review of Systems Review of Systems  Constitutional: Negative for fever.  Gastrointestinal: Negative for abdominal pain.  Genitourinary: Positive for vaginal discharge. Negative for dysuria, frequency, pelvic pain and vaginal pain.  Skin: Negative for rash and wound.  Allergic/Immunologic: Negative for immunocompromised state.  Hematological: Negative for adenopathy.  All other systems reviewed and are negative.    Physical Exam Updated Vital Signs BP 119/70 (BP Location: Right Arm)   Pulse 66   Temp 98.2 F (36.8 C) (Oral)   Resp 18   Ht 5\' 5"  (1.651 m)  Wt 59.9 kg (132 lb)   LMP 05/13/2018   SpO2 100%   BMI 21.97 kg/m   Physical Exam  Constitutional: She is oriented to person, place, and time. She appears well-developed and well-nourished. No distress.  HENT:  Head: Normocephalic and atraumatic.  Pulmonary/Chest: Effort normal.  Abdominal: Soft. She exhibits no distension. There is no tenderness.  Genitourinary:  Genitourinary Comments: RN assisted with exam, cervix friable, no CMT, no adnexal tenderness. Copious amount of thick yellow discharge  Neurological: She is alert and oriented to person, place, and time.  Skin: Skin is warm and dry. No rash noted. She is not diaphoretic.  Psychiatric: She has a normal mood and affect. Her behavior is normal.   Nursing note and vitals reviewed.    ED Treatments / Results  Labs (all labs ordered are listed, but only abnormal results are displayed) Labs Reviewed  WET PREP, GENITAL - Abnormal; Notable for the following components:      Result Value   Yeast Wet Prep HPF POC PRESENT (*)    WBC, Wet Prep HPF POC MANY (*)    All other components within normal limits  URINALYSIS, ROUTINE W REFLEX MICROSCOPIC - Abnormal; Notable for the following components:   APPearance HAZY (*)    Leukocytes, UA LARGE (*)    Bacteria, UA RARE (*)    All other components within normal limits  URINE CULTURE  POC URINE PREG, ED  GC/CHLAMYDIA PROBE AMP ([place]) NOT AT [facility]    EKG None  Radiology No results found.  Procedures Procedures (including critical care time)  Medications Ordered in ED Medications  cefTRIAXone (ROCEPHIN) injection 250 mg (250 mg Intramuscular Given 06/12/18 1308)  azithromycin (ZITHROMAX) powder 1 g (1 g Oral Given 06/12/18 1308)  lidocaine (PF) (XYLOCAINE) 1 % injection 0.9 mL (0.9 mLs Other Given 06/12/18 1308)     Initial Impression / Assessment and Plan / ED Course  I have reviewed the triage vital signs and the nursing notes.  Pertinent labs & imaging results that were available during my care of the patient were reviewed by me and considered in my medical decision making (see chart for details).  Clinical Course as of Jun 12 1350  Wed Jun 12, 2018  3732 22 year old female presents with complaint of vaginal discharge and itching.  Discharge is described as thick and yellow without odor, denies pelvic or vaginal pain or rashes.  Patient states she went to the health department for her regular STD screening 2 weeks ago was diagnosed with BV, given Flagyl which she completed and states this usually gives her a yeast infection but she was not given prophylactic Diflucan.  On exam patient has a copious thick yellow discharge, her cervix is friable, no cervical motion  tenderness, no pelvic pain.  Patient will be given Rocephin and Zithromax due to the friable cervix and discharge pending gonorrhea and Chlamydia testing.  Her wet prep shows yeast with many white cells, her pregnancy test is negative, her urinalysis shows hazy urine with large leukocytes, rare bacteria.  Urine likely contaminated due to heavy vaginal discharge.  Urine culture also sent out.  She discharged home with Diflucan, recommend take 1 tablet today and repeat with 1 tablet in 1 week if symptoms persist.  Also discussed use of probiotics now as well as daily, especially during antibiotic treatment.  She verbalizes understanding discharge instructions and plan.  She understands to call for her remaining test results in the next 5 days.   [  LM]    Clinical Course User Index [LM] Jeannie FendMurphy, Laura A, PA-C    Final Clinical Impressions(s) / ED Diagnoses   Final diagnoses:  Vaginal yeast infection    ED Discharge Orders        Ordered    fluconazole (DIFLUCAN) 150 MG tablet   Once     06/12/18 1319       Jeannie FendMurphy, Laura A, PA-C 06/12/18 1351    Derwood KaplanNanavati, Ankit, MD 06/12/18 1720

## 2018-06-13 LAB — GC/CHLAMYDIA PROBE AMP (~~LOC~~) NOT AT ARMC
Chlamydia: NEGATIVE
Neisseria Gonorrhea: NEGATIVE

## 2018-06-14 LAB — URINE CULTURE

## 2018-10-21 ENCOUNTER — Emergency Department (HOSPITAL_COMMUNITY)
Admission: EM | Admit: 2018-10-21 | Discharge: 2018-10-21 | Disposition: A | Discharge status: Home / Self Care | Payer: Medicaid Other | Attending: Emergency Medicine | Admitting: Emergency Medicine

## 2018-10-21 ENCOUNTER — Other Ambulatory Visit: Payer: Self-pay

## 2018-10-21 ENCOUNTER — Encounter (HOSPITAL_COMMUNITY): Payer: Self-pay | Admitting: Emergency Medicine

## 2018-10-21 ENCOUNTER — Emergency Department (HOSPITAL_COMMUNITY): Payer: Medicaid Other

## 2018-10-21 DIAGNOSIS — I10 Essential (primary) hypertension: Secondary | ICD-10-CM | POA: Diagnosis not present

## 2018-10-21 DIAGNOSIS — R0789 Other chest pain: Secondary | ICD-10-CM | POA: Diagnosis not present

## 2018-10-21 LAB — I-STAT TROPONIN, ED: TROPONIN I, POC: 0 ng/mL (ref 0.00–0.08)

## 2018-10-21 LAB — CBC
HCT: 43.3 % (ref 36.0–46.0)
Hemoglobin: 13.5 g/dL (ref 12.0–15.0)
MCH: 28.5 pg (ref 26.0–34.0)
MCHC: 31.2 g/dL (ref 30.0–36.0)
MCV: 91.4 fL (ref 80.0–100.0)
PLATELETS: 217 10*3/uL (ref 150–400)
RBC: 4.74 MIL/uL (ref 3.87–5.11)
RDW: 12.4 % (ref 11.5–15.5)
WBC: 7.3 10*3/uL (ref 4.0–10.5)
nRBC: 0 % (ref 0.0–0.2)

## 2018-10-21 LAB — BASIC METABOLIC PANEL
Anion gap: 8 (ref 5–15)
BUN: 7 mg/dL (ref 6–20)
CALCIUM: 9.3 mg/dL (ref 8.9–10.3)
CO2: 26 mmol/L (ref 22–32)
CREATININE: 0.89 mg/dL (ref 0.44–1.00)
Chloride: 107 mmol/L (ref 98–111)
GFR calc non Af Amer: >60 mL/min (ref >60)
Glucose, Bld: 103 mg/dL — ABNORMAL HIGH (ref 70–99)
Potassium: 3.8 mmol/L (ref 3.5–5.1)
SODIUM: 141 mmol/L (ref 135–145)

## 2018-10-21 LAB — I-STAT BETA HCG BLOOD, ED (MC, WL, AP ONLY): I-stat hCG, quantitative: <5 m[IU]/mL (ref <5)

## 2018-10-21 LAB — D-DIMER, QUANTITATIVE: D-Dimer, Quant: <0.27 ug/mL-FEU (ref 0.00–0.50)

## 2018-10-21 MED ORDER — NAPROXEN 500 MG PO TABS: 500.0000 mg | ORAL_TABLET | Freq: Two times a day (BID) | ORAL | 0 refills | Status: DC

## 2018-10-21 MED ORDER — KETOROLAC TROMETHAMINE 15 MG/ML IJ SOLN
15.0000 mg | Freq: Once | INTRAMUSCULAR | Status: AC
  Administered 2018-10-21: 15 mg via INTRAMUSCULAR
  Filled 2018-10-21: qty 1

## 2018-10-21 NOTE — ED Triage Notes (Signed)
Pt. Stated, I started having back pain yesterday on the left side and now its chest pain on the left side.

## 2018-10-21 NOTE — ED Notes (Signed)
Patient transported to X-ray 

## 2018-10-21 NOTE — Discharge Instructions (Addendum)
You were evaluated today for chest pain. You labs, imaging and EKG were negative. You states that the Toradol injection helped with the pain. This is likely musculoskeletal in nature. I have written you a prescription for Naproxen, an antiinflammatory. Please take as prescribed. Please follow up with your PCP for reevaluation.  Return to the Ed for any new or worsening symptoms

## 2018-10-21 NOTE — ED Provider Notes (Signed)
[facility] EMERGENCY DEPARTMENT Provider Note   CSN: [id number] Arrival date & time: 10/21/18  1723   History   Chief Complaint Chief Complaint  Patient presents with  . Back Pain  . Chest Pain    HPI Emily Poole is a 22 y.o. female with past medical history significant for  OCP use who presents for evaluation of chest pain.  Patient states that yesterday she noticed she had left back pain under her scapula.  When she awoke this morning she states that this pain radiated to her chest.  States pain has been constant in nature.  Rates her pain a 6/10. She took ibuprofen once yesterday without relief of symptoms.  Denies associated lightheaded or dizziness, diaphoresis, dyspnea on exertion. Denies history of diabetes, hypercholesterolemia, family cardiac issues.  Patient states her chest pain is worse with deep breathing and movement.  States " I have no pain if I lay still."  Denies fever, chills, nausea, vomiting, headache, blurred vision, unilateral leg swelling, hemoptysis, neck pain or neck stiffness, dizziness, weakness. Denies hx diabetes, HTN, hyperlipemia, family history of cardiac pathology.  History obtained from patient.  No interpreter was used.  HPI  Past Medical History:  Diagnosis Date  . Cervical condyloma   . Chlamydia   . Hypertension   . Pregnancy affected by intrauterine growth retardation (IUGR)     Patient Active Problem List   Diagnosis Date Noted  . IUGR (intrauterine growth restriction) 12/24/2014  . Hypertension affecting pregnancy in second trimester, antepartum   . Small for gestational age fetus   . IUGR (intrauterine growth restriction) affecting care of mother   . [redacted] weeks gestation of pregnancy   . Gestational hypertension 12/14/2014  . 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 Suspected fetal anomaly, antepartum   . 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 Supervision of normal first teen pregnancy 08/24/2014    Past Surgical History:    Procedure Laterality Date  . CESAREAN SECTION N/A 12/25/2014   Procedure: CESAREAN SECTION;  Surgeon: Lesly Dukes, MD;  Location: [facility];  Service: Obstetrics;  Laterality: N/A;  . WISDOM TOOTH EXTRACTION       OB History    Gravida  1   Para  1   Term      Preterm  1   AB      Living  1     SAB      TAB      Ectopic      Multiple  0   Live Births  1            Home Medications    Prior to Admission medications   Medication Sig Start Date End Date Taking? Authorizing Provider  naproxen (NAPROSYN) 500 MG tablet Take 1 tablet (500 mg total) by mouth 2 (two) times daily. 10/21/18   Henderly, Britni A, PA-C    Family History Family History  Problem Relation Age of Onset  . Diabetes Father     Social History Social History   Tobacco Use  . Smoking status: Never Smoker  . Smokeless tobacco: Never Used  Substance Use Topics  . Alcohol use: No  . Drug use: No     Allergies   Patient has no known allergies.   Review of Systems Review of Systems  Constitutional: Negative.   HENT: Negative.   Respiratory: Negative.   Cardiovascular: Positive for chest pain. Negative for palpitations and leg swelling.  Gastrointestinal: Negative.   Genitourinary: Negative.  Musculoskeletal: Negative.   Skin: Negative.   Neurological: Negative.   All other systems reviewed and are negative.    Physical Exam Updated Vital Signs BP 122/70   Pulse 69   Temp 98.9 F (37.2 C) (Oral)   Resp 16   Ht 5\' 5"  (1.651 m)   Wt 66.2 kg   SpO2 99%   BMI 24.30 kg/m   Physical Exam  Constitutional: She appears well-developed and well-nourished.  Non-toxic appearance. She does not appear ill. No distress.  HENT:  Head: Atraumatic.  Eyes: Pupils are equal, round, and reactive to light.  Neck: Normal range of motion. Neck supple. No JVD present.  Cardiovascular: Normal rate, regular rhythm, intact distal pulses and normal pulses.  Pulmonary/Chest: Effort normal  and breath sounds normal. No accessory muscle usage or stridor. No tachypnea. No respiratory distress. She has no decreased breath sounds. She has no wheezes. She has no rhonchi. She has no rales. She exhibits tenderness. She exhibits no mass, no laceration, no crepitus, no edema and no swelling.  Chest wall tenderness palpation to left upper chest as well as left scapula.  Abdominal: Soft. Bowel sounds are normal. She exhibits no distension, no ascites and no mass. There is no tenderness. There is no rebound and no guarding.  Musculoskeletal: Normal range of motion.       Right lower leg: Normal. She exhibits no tenderness and no edema.       Left lower leg: Normal. She exhibits no tenderness and no edema.  Neurological: She is alert.  Skin: Skin is warm and dry.  Psychiatric: She has a normal mood and affect.  Nursing note and vitals reviewed.    ED Treatments / Results  Labs (all labs ordered are listed, but only abnormal results are displayed) Labs Reviewed  BASIC METABOLIC PANEL - Abnormal; Notable for the following components:      Result Value   Glucose, Bld 103 (*)    All other components within normal limits  CBC  D-DIMER, QUANTITATIVE (NOT AT [facility])  I-STAT TROPONIN, ED  I-STAT BETA HCG BLOOD, ED ([facility], [facility], [facility] ONLY)  I-STAT BETA HCG BLOOD, ED ([facility], [facility], [facility] ONLY)    EKG EKG Interpretation  Date/Time:  Monday October 21 2018 20:34:15 EST Ventricular Rate:  66 PR Interval:    QRS Duration: 89 QT Interval:  363 QTC Calculation: 381 R Axis:   87 Text Interpretation:  Sinus rhythm Prolonged PR interval No old tracing to compare Confirmed by Mancel BaleWentz, Elliott [phone]([id number]) on 10/21/2018 8:51:52 PM   Radiology Dg Chest 2 View  Result Date: 10/21/2018 CLINICAL DATA:  Back pain starting yesterday on the left. EXAM: CHEST - 2 VIEW COMPARISON:  09/25/2015 FINDINGS: The heart size and mediastinal contours are within normal limits. Both lungs are clear. The visualized skeletal structures  are unremarkable. IMPRESSION: No active cardiopulmonary disease. Electronically Signed   By: Tollie Ethavid  Kwon M.D.   On: 10/21/2018 18:07    Procedures Procedures (including critical care time)  Medications Ordered in ED Medications  ketorolac (TORADOL) 15 MG/ML injection 15 mg (15 mg Intramuscular Given 10/21/18 1958)     Initial Impression / Assessment and Plan / ED Course  I have reviewed the triage vital signs and the nursing notes.  Pertinent labs & imaging results that were available during my care of the patient were reviewed by me and considered in my medical decision making (see chart for details).  22 year old who appears otherwise well presents for evaluation of chest  pain and back pain.  Onset yesterday morning, 36 hours ago.  Pain has been constant in nature.  Pain is worse with deep breathing and movement. Pain is reduced with laying still. Denies associated diaphoresis, shortness of breath, weakness.  Afebrile, nonseptic, non-ill-appearing.  Patient with tenderness over left scapula and left chest wall. Heart score low risk, PERC negative. Will obtain labs, EKG, chest xray and reevaluate.  Clinical Course as of Oct 21 2226  Mon Oct 21, 2018  4010 No evidence of infiltrates, widened mediastinum, CHF or pulmonary edema  DG Chest 2 View [BH]    Clinical Course User Index [BH] Henderly, Britni A, PA-C   Reevaluation patient states her pain is "all better and almost gone" after Toradol.  Troponin negative, hCG negative, d-dimer negative, CBC without evidence of leukocytosis, metabolic panel with mild elevation glucose at 103.  Chest x-ray negative for infiltrates, CHF, pulmonary edema or widened mediastinum. EKG normal sinus rhythm. Patient is hemodynamically stable and in no acute distress.   Patient is to be discharged with recommendation to follow up with PCP in regards to today's hospital visit. Chest pain is not likely of cardiac or pulmonary etiology d/t presentation, PERC  negative, VSS, no tracheal deviation, no JVD or new murmur, RRR, breath sounds equal bilaterally, EKG without acute abnormalities, negative troponin, and negative CXR. Low suspicion for dissection. Pt has been advised to return to the ED if CP becomes exertional, associated with diaphoresis or nausea, radiates to left jaw/arm, worsens or becomes concerning in any way.  She is hemodynamically stable and appropriate for DC home at this time.  Discussed with patient strict return precautions.  Patient voiced understanding is agreeable for follow-up.    Final Clinical Impressions(s) / ED Diagnoses   Final diagnoses:  Chest wall pain    ED Discharge Orders         Ordered    naproxen (NAPROSYN) 500 MG tablet  2 times daily     10/21/18 2109           Henderly, Britni A, PA-C 10/21/18 2227    Mancel Bale, MD 10/22/18 0022

## 2019-03-31 ENCOUNTER — Encounter (HOSPITAL_COMMUNITY): Payer: Self-pay | Admitting: Emergency Medicine

## 2019-03-31 ENCOUNTER — Other Ambulatory Visit: Payer: Self-pay

## 2019-03-31 ENCOUNTER — Emergency Department (HOSPITAL_COMMUNITY)
Admission: EM | Admit: 2019-03-31 | Discharge: 2019-03-31 | Disposition: A | Discharge status: Home / Self Care | Payer: Medicaid Other | Attending: Emergency Medicine | Admitting: Emergency Medicine

## 2019-03-31 DIAGNOSIS — I10 Essential (primary) hypertension: Secondary | ICD-10-CM | POA: Diagnosis not present

## 2019-03-31 DIAGNOSIS — N898 Other specified noninflammatory disorders of vagina: Secondary | ICD-10-CM | POA: Diagnosis not present

## 2019-03-31 DIAGNOSIS — Z791 Long term (current) use of non-steroidal anti-inflammatories (NSAID): Secondary | ICD-10-CM | POA: Diagnosis not present

## 2019-03-31 LAB — POC URINE PREG, ED: Preg Test, Ur: NEGATIVE

## 2019-03-31 LAB — WET PREP, GENITAL
Clue Cells Wet Prep HPF POC: NONE SEEN
Sperm: NONE SEEN
Trich, Wet Prep: NONE SEEN
Yeast Wet Prep HPF POC: NONE SEEN

## 2019-03-31 LAB — URINALYSIS, ROUTINE W REFLEX MICROSCOPIC
Bilirubin Urine: NEGATIVE
Glucose, UA: NEGATIVE mg/dL
Hgb urine dipstick: NEGATIVE
Ketones, ur: NEGATIVE mg/dL
Nitrite: NEGATIVE
Protein, ur: NEGATIVE mg/dL
Specific Gravity, Urine: 1.019 (ref 1.005–1.030)
pH: 7 (ref 5.0–8.0)

## 2019-03-31 MED ORDER — CEFTRIAXONE SODIUM 250 MG IJ SOLR
250.0000 mg | Freq: Once | INTRAMUSCULAR | Status: AC
  Administered 2019-03-31: 14:00:00 250 mg via INTRAMUSCULAR
  Filled 2019-03-31: qty 250

## 2019-03-31 MED ORDER — STERILE WATER FOR INJECTION IJ SOLN
INTRAMUSCULAR | Status: AC
  Administered 2019-03-31: 10 mL
  Filled 2019-03-31: qty 10

## 2019-03-31 MED ORDER — AZITHROMYCIN 250 MG PO TABS
1000.0000 mg | ORAL_TABLET | Freq: Once | ORAL | Status: AC
  Administered 2019-03-31: 1000 mg via ORAL
  Filled 2019-03-31: qty 4

## 2019-03-31 NOTE — ED Triage Notes (Signed)
Pt c/o vaginal discomfort for couple days. Denies discharge, odors or urinary problems.

## 2019-03-31 NOTE — Discharge Instructions (Signed)
You were seen in the ED for vaginal irritation.  Skin looked a little red. There was some discharge on exam.   You tested negative for yeast infection, bacterial vaginosis, and trichomonas.  Your urine looked somewhat dirty but you had no symptoms. Urine culture was sent, you will be called if urine grows a bacteria and may need antibiotics for this at that time.   Gonorrhea and chlamydia results are pending.  Given your discharge and vaginal irritation these may be the cause. You were treated for these infections today.   Use only water to rinse vaginal area, apply thin layer of vaseline to the area three times a day.  May do sitz baths at home.    Do not have sexual encounter until symptoms completely resolve and 7 days from today.   Return for worsening pain, fever, abnormal vaginal discharge or bleeding, pelvic pain

## 2019-03-31 NOTE — ED Notes (Signed)
Pelvic cart set up 

## 2019-03-31 NOTE — ED Provider Notes (Signed)
[facility]   CSN: [id number] Arrival date & time: 03/31/19  1116    History   Chief Complaint Chief Complaint  Patient presents with  . Vaginal Pain    HPI Emily Poole is a 23 y.o. female here for evaluation of vaginal irritation described as itchy, dry, "raw", red. Onset 2 days ago. Constant. Worse with wearing underwear, wiping.  Sexually active with one female partner without condom use. Has nexplanon, can't remember LMP. Last vaginal intercourse 3 days ago the day before vaginal irritation began.  They used a condom and she wonders if this caused a skin reaction.  No interventions. No alleviating factors.   No dysuria, urinary frequency or urgency, abnormal vaginal bleeding/spotting. No changes to topical agents or soaps. She is not concerned about STD.  No fever, abdominal pain or flank pain.      HPI  Past Medical History:  Diagnosis Date  . Cervical condyloma   . Chlamydia   . Hypertension   . Pregnancy affected by intrauterine growth retardation (IUGR)     Patient Active Problem List   Diagnosis Date Noted  . IUGR (intrauterine growth restriction) 12/24/2014  . Hypertension affecting pregnancy in second trimester, antepartum   . Small for gestational age fetus   . IUGR (intrauterine growth restriction) affecting care of mother   . [redacted] weeks gestation of pregnancy   . Gestational hypertension 12/14/2014  . 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 Suspected fetal anomaly, antepartum   . 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 Supervision of normal first teen pregnancy 08/24/2014    Past Surgical History:  Procedure Laterality Date  . CESAREAN SECTION N/A 12/25/2014   Procedure: CESAREAN SECTION;  Surgeon: Lesly Dukes, MD;  Location: [facility];  Service: Obstetrics;  Laterality: N/A;  . WISDOM TOOTH EXTRACTION       OB History    Gravida  1   Para  1   Term      Preterm  1   AB      Living  1     SAB      TAB      Ectopic      Multiple  0   Live Births  1            Home Medications    Prior to Admission medications   Medication Sig Start Date End Date Taking? Authorizing Provider  naproxen (NAPROSYN) 500 MG tablet Take 1 tablet (500 mg total) by mouth 2 (two) times daily. 10/21/18   Henderly, Britni A, PA-C    Family History Family History  Problem Relation Age of Onset  . Diabetes Father     Social History Social History   Tobacco Use  . Smoking status: Never Smoker  . Smokeless tobacco: Never Used  Substance Use Topics  . Alcohol use: No  . Drug use: No     Allergies   Patient has no known allergies.   Review of Systems Review of Systems  Genitourinary: Positive for vaginal pain (irritation, itching).  All other systems reviewed and are negative.    Physical Exam Updated Vital Signs BP (!) 99/51   Pulse (!) 58   Temp 98.8 F (37.1 C) (Oral)   Resp 18   Ht 5' 4.5" (1.638 m)   Wt 55.5 kg   SpO2 100%   BMI 20.69 kg/m   Physical Exam Vitals signs and nursing note reviewed. Exam conducted with a chaperone present.  Constitutional:  Appearance: She is well-developed.     Comments: Non toxic in NAD  HENT:     Head: Normocephalic and atraumatic.     Nose: Nose normal.  Eyes:     Conjunctiva/sclera: Conjunctivae normal.  Neck:     Musculoskeletal: Normal range of motion.  Cardiovascular:     Rate and Rhythm: Normal rate and regular rhythm.  Pulmonary:     Effort: Pulmonary effort is normal.     Breath sounds: Normal breath sounds.  Abdominal:     General: Bowel sounds are normal.     Palpations: Abdomen is soft.     Tenderness: There is no abdominal tenderness.     Comments: No G/R/R. No suprapubic or CVA tenderness. Negative Murphy's and McBurney's. Active BS to lower quadrants.   Genitourinary:    Exam position: Lithotomy position.     Labia:        Right: Tenderness present.        Left: Tenderness present.      Vagina: Vaginal  discharge, erythema and tenderness present.     Cervix: Discharge present.     Comments: Exam performed with EMT at bedside for assistance. External genitalia with mild erythema and tenderness around introitus noted.  No groin lymphadenopathy.  Vaginal mucosa and cervix pink without lesions.  Moderate amount of thick white curd like discharge adherent to cervix and upper vaginal vault.  No lesions on cervix. Cervix is closed without friability. No CMT.  Nonpalpable, nontender adnexa.  Perianal skin normal without lesions.  Negative whiff test. Musculoskeletal: Normal range of motion.  Skin:    General: Skin is warm and dry.     Capillary Refill: Capillary refill takes less than 2 seconds.  Neurological:     Mental Status: She is alert.  Psychiatric:        Behavior: Behavior normal.      ED Treatments / Results  Labs (all labs ordered are listed, but only abnormal results are displayed) Labs Reviewed  WET PREP, GENITAL - Abnormal; Notable for the following components:      Result Value   WBC, Wet Prep HPF POC RARE (*)    All other components within normal limits  URINALYSIS, ROUTINE W REFLEX MICROSCOPIC - Abnormal; Notable for the following components:   Leukocytes,Ua LARGE (*)    Bacteria, UA RARE (*)    All other components within normal limits  URINE CULTURE  POC URINE PREG, ED  GC/CHLAMYDIA PROBE AMP ([place]) NOT AT [facility]    EKG None  Radiology No results found.  Procedures Procedures (including critical care time)  Medications Ordered in ED Medications  cefTRIAXone (ROCEPHIN) injection 250 mg (250 mg Intramuscular Given 03/31/19 1418)  azithromycin (ZITHROMAX) tablet 1,000 mg (1,000 mg Oral Given 03/31/19 1419)  sterile water (preservative free) injection (10 mLs  Given 03/31/19 1419)     Initial Impression / Assessment and Plan / ED Course  I have reviewed the triage vital signs and the nursing notes.  Pertinent labs & imaging results that were available  during my care of the patient were reviewed by me and considered in my medical decision making (see chart for details).  Clinical Course as of Mar 31 2239  Mon Mar 31, 2019  1302 Leukocytes,Ua(!): LARGE [CG]  1302 WBC, UA: 6-10 [CG]  1302 Bacteria, UA(!): RARE [CG]  1401 Leukocytes,Ua(!): LARGE [CG]  1401 WBC, UA: 6-10 [CG]  1401 Bacteria, UA(!): RARE [CG]    Clinical Course User Index [CG] Margarette Asal,  Delia Headylaudia J, PA-C       Vaginal irritation possibility from recent intercourse or condom used causing mild skin breakdown/irritation.  No classic herpes lesions on exam today. She has thick vaginal discharge, given sexual practices without condom concern for STD.  No constitutional symptoms, abdominal/flank pain. Abdomen exam today benign.   No CMT, fever, abdominal/pelvic pain and doubt PID. Will obtain UA, pelvic, reassess.   Final Clinical Impressions(s) / ED Diagnoses   UA has large leukocytes, 6-10 WBC and rare bacteria with mucus. Given vaginal discharge on exam this may be contaminated. Other than external genitalia irritation she has no classic UTI symptoms. Urine sent for culture.  Will defer antibiotics for UTI today given vaginal discharge, possible cross contamination and lack of UTI symptoms.  Wet prep with WBCs. Treated with ctx and azithromycin in ED. Dc with sitz baths, vaseline and avoidance of skin irritants, pelvic rest.  Safe sex practices briefly discussed. Return precautions given. Pt comfortable with this. Final diagnoses:  Vaginal irritation    ED Discharge Orders    None       Jerrell MylarGibbons, Claudia J, PA-C 03/31/19 2240    Geoffery Lyonselo, Douglas, MD 04/03/19 [phone]

## 2019-04-01 LAB — URINE CULTURE: Culture: NO GROWTH

## 2019-04-01 LAB — GC/CHLAMYDIA PROBE AMP (~~LOC~~) NOT AT ARMC
Chlamydia: NEGATIVE
Neisseria Gonorrhea: NEGATIVE

## 2019-05-19 ENCOUNTER — Other Ambulatory Visit: Payer: Self-pay

## 2019-05-19 ENCOUNTER — Encounter (HOSPITAL_COMMUNITY): Payer: Self-pay

## 2019-05-19 ENCOUNTER — Emergency Department (HOSPITAL_COMMUNITY)
Admission: EM | Admit: 2019-05-19 | Discharge: 2019-05-19 | Disposition: A | Discharge status: Home / Self Care | Payer: Medicaid Other | Attending: Emergency Medicine | Admitting: Emergency Medicine

## 2019-05-19 DIAGNOSIS — L292 Pruritus vulvae: Secondary | ICD-10-CM | POA: Insufficient documentation

## 2019-05-19 DIAGNOSIS — Z5321 Procedure and treatment not carried out due to patient leaving prior to being seen by health care provider: Secondary | ICD-10-CM | POA: Diagnosis not present

## 2019-05-19 NOTE — ED Notes (Signed)
Called for patient in [place].

## 2019-05-19 NOTE — ED Triage Notes (Signed)
Pt states that she thinks she has a yeast infection . Pt has hx of the same.

## 2019-05-22 ENCOUNTER — Other Ambulatory Visit: Payer: Self-pay

## 2019-05-22 ENCOUNTER — Ambulatory Visit (HOSPITAL_COMMUNITY)
Admission: EM | Admit: 2019-05-22 | Discharge: 2019-05-22 | Disposition: A | Discharge status: Home / Self Care | Payer: Medicaid Other | Attending: Family Medicine | Admitting: Family Medicine

## 2019-05-22 ENCOUNTER — Encounter (HOSPITAL_COMMUNITY): Payer: Self-pay | Admitting: Emergency Medicine

## 2019-05-22 DIAGNOSIS — N760 Acute vaginitis: Secondary | ICD-10-CM | POA: Diagnosis not present

## 2019-05-22 MED ORDER — FLUCONAZOLE 150 MG PO TABS: 150.0000 mg | ORAL_TABLET | Freq: Every day | ORAL | 0 refills | Status: DC

## 2019-05-22 MED ORDER — METRONIDAZOLE 500 MG PO TABS: 500.0000 mg | ORAL_TABLET | Freq: Two times a day (BID) | ORAL | 0 refills | Status: DC

## 2019-05-22 NOTE — Discharge Instructions (Signed)
Take the metronidazole for BV Take the diflucan for yeast This should take care of your vaginal irritation Call for problems

## 2019-05-22 NOTE — ED Triage Notes (Signed)
PT reports white vaginal discharge with irritation and an odor. Itching present as well. PT has had multiple yeast infections in the past.

## 2019-05-22 NOTE — ED Provider Notes (Signed)
[facility]    CSN: [id number] Arrival date & time: 05/22/19  1429      History   Chief Complaint Chief Complaint  Patient presents with  . Vaginal Discharge    HPI Emily Poole Well is a 23 y.o. female.   HPI Patient is here for vaginitis symptoms.  Is certain she is not pregnant.  Is certain she is not exposed to STD.  She has had yeast and BV on a number of occasions.  She currently has discharge, itching, and odor.  She would like to be treated for these.  Declines additional testing.  No urinary symptoms.  No fever.  No abdominal pain Past Medical History:  Diagnosis Date  . Cervical condyloma   . Chlamydia   . Hypertension   . Pregnancy affected by intrauterine growth retardation (IUGR)     Patient Active Problem List   Diagnosis Date Noted  . IUGR (intrauterine growth restriction) 12/24/2014  . Hypertension affecting pregnancy in second trimester, antepartum   . Small for gestational age fetus   . IUGR (intrauterine growth restriction) affecting care of mother   . [redacted] weeks gestation of pregnancy   . Gestational hypertension 12/14/2014  . 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 Suspected fetal anomaly, antepartum   . 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 Supervision of normal first teen pregnancy 08/24/2014    Past Surgical History:  Procedure Laterality Date  . CESAREAN SECTION N/A 12/25/2014   Procedure: CESAREAN SECTION;  Surgeon: Lesly DukesKelly H Leggett, MD;  Location: [facility];  Service: Obstetrics;  Laterality: N/A;  . WISDOM TOOTH EXTRACTION      OB History    Gravida  1   Para  1   Term      Preterm  1   AB      Living  1     SAB      TAB      Ectopic      Multiple  0   Live Births  1            Home Medications    Prior to Admission medications   Medication Sig Start Date End Date Taking? Authorizing Provider  fluconazole (DIFLUCAN) 150 MG tablet Take 1 tablet (150 mg total) by mouth daily. Repeat in 1 week if needed 05/22/19   Eustace MooreNelson,   Sue, MD  metroNIDAZOLE (FLAGYL) 500 MG tablet Take 1 tablet (500 mg total) by mouth 2 (two) times daily. 05/22/19   Eustace MooreNelson,  Sue, MD  naproxen (NAPROSYN) 500 MG tablet Take 1 tablet (500 mg total) by mouth 2 (two) times daily. 10/21/18   Henderly, Britni A, PA-C    Family History Family History  Problem Relation Age of Onset  . Diabetes Father     Social History Social History   Tobacco Use  . Smoking status: Never Smoker  . Smokeless tobacco: Never Used  Substance Use Topics  . Alcohol use: No  . Drug use: No     Allergies   Patient has no known allergies.   Review of Systems Review of Systems  Constitutional: Negative for chills and fever.  HENT: Negative for ear pain and sore throat.   Eyes: Negative for pain and visual disturbance.  Respiratory: Negative for cough and shortness of breath.   Cardiovascular: Negative for chest pain and palpitations.  Gastrointestinal: Negative for abdominal pain and vomiting.  Genitourinary: Positive for vaginal discharge. Negative for dysuria and hematuria.  Musculoskeletal: Negative for arthralgias and back pain.  Skin: Negative for color change and rash.  Neurological: Negative for seizures and syncope.  All other systems reviewed and are negative.    Physical Exam Triage Vital Signs ED Triage Vitals  Enc Vitals Group     BP 05/22/19 1535 108/68     Pulse Rate 05/22/19 1535 64     Resp 05/22/19 1535 16     Temp 05/22/19 1535 98.4 F (36.9 C)     Temp Source 05/22/19 1535 Oral     SpO2 05/22/19 1535 100 %     Weight --      Height --      Head Circumference --      Peak Flow --      Pain Score 05/22/19 1534 6     Pain Loc --      Pain Edu? --      Excl. in [place]? --    No data found.  Updated Vital Signs BP 108/68 (BP Location: Left Arm)   Pulse 64   Temp 98.4 F (36.9 C) (Oral)   Resp 16   SpO2 100%       Physical Exam Constitutional:      General: She is not in acute distress.    Appearance:  She is well-developed.  HENT:     Head: Normocephalic and atraumatic.  Eyes:     Conjunctiva/sclera: Conjunctivae normal.     Pupils: Pupils are equal, round, and reactive to light.  Neck:     Musculoskeletal: Normal range of motion.  Cardiovascular:     Rate and Rhythm: Normal rate.  Pulmonary:     Effort: Pulmonary effort is normal. No respiratory distress.  Abdominal:     General: There is no distension.     Palpations: Abdomen is soft.  Genitourinary:    Comments: No reported lesion or indication for vaginal examination Musculoskeletal: Normal range of motion.  Skin:    General: Skin is warm and dry.  Neurological:     Mental Status: She is alert.      UC Treatments / Results  Labs (all labs ordered are listed, but only abnormal results are displayed) Labs Reviewed - No data to display  EKG None  Radiology No results found.  Procedures Procedures (including critical care time)  Medications Ordered in UC Medications - No data to display  Initial Impression / Assessment and Plan / UC Course  I have reviewed the triage vital signs and the nursing notes.  Pertinent labs & imaging results that were available during my care of the patient were reviewed by me and considered in my medical decision making (see chart for details).      Final Clinical Impressions(s) / UC Diagnoses   Final diagnoses:  Acute vaginitis     Discharge Instructions     Take the metronidazole for BV Take the diflucan for yeast This should take care of your vaginal irritation Call for problems   ED Prescriptions    Medication Sig Dispense Auth. Provider   metroNIDAZOLE (FLAGYL) 500 MG tablet Take 1 tablet (500 mg total) by mouth 2 (two) times daily. 14 tablet Raylene Everts, MD   fluconazole (DIFLUCAN) 150 MG tablet Take 1 tablet (150 mg total) by mouth daily. Repeat in 1 week if needed 2 tablet Raylene Everts, MD     Controlled Substance Prescriptions [place] Controlled  Substance Registry consulted? Not Applicable   Raylene Everts, MD 05/22/19 [phone]

## 2019-07-01 ENCOUNTER — Other Ambulatory Visit: Payer: Self-pay

## 2019-07-01 ENCOUNTER — Encounter (HOSPITAL_COMMUNITY): Payer: Self-pay

## 2019-07-01 ENCOUNTER — Ambulatory Visit (HOSPITAL_COMMUNITY)
Admission: EM | Admit: 2019-07-01 | Discharge: 2019-07-01 | Disposition: A | Discharge status: Home / Self Care | Payer: Medicaid Other | Attending: Family Medicine | Admitting: Family Medicine

## 2019-07-01 DIAGNOSIS — N898 Other specified noninflammatory disorders of vagina: Secondary | ICD-10-CM | POA: Diagnosis present

## 2019-07-01 MED ORDER — METRONIDAZOLE 500 MG PO TABS: 500.0000 mg | ORAL_TABLET | Freq: Two times a day (BID) | ORAL | 0 refills | Status: DC

## 2019-07-01 MED ORDER — FLUCONAZOLE 150 MG PO TABS: 150.0000 mg | ORAL_TABLET | Freq: Every day | ORAL | 0 refills | Status: DC

## 2019-07-01 NOTE — Discharge Instructions (Addendum)
You have been given the following medications today for treatment of possible BV and/or a yeast infection:  Meds ordered this encounter  Medications   fluconazole (DIFLUCAN) 150 MG tablet    Sig: Take 1 tablet (150 mg total) by mouth daily. Repeat in 1 week if needed    Dispense:  2 tablet    Refill:  0   metroNIDAZOLE (FLAGYL) 500 MG tablet    Sig: Take 1 tablet (500 mg total) by mouth 2 (two) times daily.    Dispense:  14 tablet    Refill:  0   Even though we have treated you today, we have sent testing for sexually transmitted infections. We will notify you of any positive results once they are received. If required, we will prescribe any medications you might need.  Please refrain from all sexual activity for at least the next seven days.

## 2019-07-01 NOTE — ED Triage Notes (Signed)
Pt states she has a yeast infection x 3 days. 

## 2019-07-01 NOTE — ED Provider Notes (Signed)
[facility]   [id number] 07/01/19 Arrival Time: 0946  ASSESSMENT & PLAN:  1. Vaginal discharge    No signs/symptoms of PID. Benign abdomen.   Discharge Instructions      You have been given the following medications today for treatment of possible BV and/or a yeast infection:  Meds ordered this encounter  Medications  . fluconazole (DIFLUCAN) 150 MG tablet    Sig: Take 1 tablet (150 mg total) by mouth daily. Repeat in 1 week if needed    Dispense:  2 tablet    Refill:  0  . metroNIDAZOLE (FLAGYL) 500 MG tablet    Sig: Take 1 tablet (500 mg total) by mouth 2 (two) times daily.    Dispense:  14 tablet    Refill:  0   Even though we have treated you today, we have sent testing for sexually transmitted infections. We will notify you of any positive results once they are received. If required, we will prescribe any medications you might need.  Please refrain from all sexual activity for at least the next seven days.     Pending: Labs Reviewed  CERVICOVAGINAL ANCILLARY ONLY    Will notify of any positive results. Instructed to refrain from sexual activity for at least seven days.  Reviewed expectations re: course of current medical issues. Questions answered. Outlined signs and symptoms indicating need for more acute intervention. Patient verbalized understanding. After Visit Summary given.   SUBJECTIVE:  Emily Poole is a 23 y.o. female who presents with complaint of vaginal discharge. Onset gradual. First noticed apprxo 3 d ago. Describes discharge as thick and white/yellow. With vaginal itching. Urinary symptoms: denies urinary frequency, dysuria, gross hematuria. Afebrile. No abdominal or pelvic pain. No n/v. No rashes or lesions. Sexually active with single female partner without regular condom use. OTC treatment: none. History of STI: h/o BV reported; occasional yeast infection; "kind of feel the same".  No LMP recorded. Patient has had an implant.   ROS: As per HPI. All other systems negative.   OBJECTIVE:  Vitals:   07/01/19 1013 07/01/19 1014  BP:  109/72  Pulse:  61  Resp:  16  Temp:  97.8 F (36.6 C)  TempSrc:  Oral  SpO2:  100%  Weight: 61.2 kg      General appearance: alert, cooperative, appears stated age and no distress Throat: lips, mucosa, and tongue normal; teeth and gums normal CV: RRR Lungs: CTAB Back: no CVA tenderness; FROM at waist Abdomen: soft, non-tender GU: deferred Skin: warm and dry Psychological: alert and cooperative; normal mood and affect.  Labs Reviewed  CERVICOVAGINAL ANCILLARY ONLY    No Known Allergies  Past Medical History:  Diagnosis Date  . Cervical condyloma   . Chlamydia   . Hypertension   . Pregnancy affected by intrauterine growth retardation (IUGR)    Family History  Problem Relation Age of Onset  . Diabetes Father    Social History   Socioeconomic History  . Marital status: Single    Spouse name: Not on file  . Number of children: Not on file  . Years of education: Not on file  . Highest education level: Not on file  Occupational History  . Not on file  Social Needs  . Financial resource strain: Not on file  . Food insecurity    Worry: Not on file    Inability: Not on file  . Transportation needs    Medical: Not on file    Non-medical: Not  on file  Tobacco Use  . Smoking status: Never Smoker  . Smokeless tobacco: Never Used  Substance and Sexual Activity  . Alcohol use: No  . Drug use: No  . Sexual activity: Yes    Birth control/protection: Implant  Lifestyle  . Physical activity    Days per week: Not on file    Minutes per session: Not on file  . Stress: Not on file  Relationships  . Social Herbalist on phone: Not on file    Gets together: Not on file    Attends religious service: Not on file    Active member of club or organization: Not on file    Attends meetings of clubs or organizations: Not on file    Relationship status: Not  on file  . Intimate partner violence    Fear of current or ex partner: Not on file    Emotionally abused: Not on file    Physically abused: Not on file    Forced sexual activity: Not on file  Other Topics Concern  . Not on file  Social History Narrative  . Not on file          Vanessa Kick, MD 07/01/19 1037

## 2019-07-03 LAB — CERVICOVAGINAL ANCILLARY ONLY
Bacterial vaginitis: POSITIVE — AB
Candida vaginitis: POSITIVE — AB
Chlamydia: NEGATIVE
Neisseria Gonorrhea: NEGATIVE
Trichomonas: POSITIVE — AB

## 2019-07-04 ENCOUNTER — Encounter (HOSPITAL_COMMUNITY): Payer: Self-pay

## 2019-07-07 ENCOUNTER — Telehealth (HOSPITAL_COMMUNITY): Payer: Self-pay | Admitting: Emergency Medicine

## 2019-07-07 NOTE — Telephone Encounter (Signed)
Patient contacted and made aware of swab results, all questions answered.   

## 2019-08-13 ENCOUNTER — Encounter (HOSPITAL_COMMUNITY): Payer: Self-pay

## 2019-08-13 ENCOUNTER — Other Ambulatory Visit: Payer: Self-pay

## 2019-08-13 ENCOUNTER — Ambulatory Visit (HOSPITAL_COMMUNITY)
Admission: EM | Admit: 2019-08-13 | Discharge: 2019-08-13 | Disposition: A | Discharge status: Home / Self Care | Payer: Medicaid Other | Attending: Family Medicine | Admitting: Family Medicine

## 2019-08-13 DIAGNOSIS — N760 Acute vaginitis: Secondary | ICD-10-CM | POA: Diagnosis not present

## 2019-08-13 MED ORDER — METRONIDAZOLE 500 MG PO TABS: 500.0000 mg | ORAL_TABLET | Freq: Two times a day (BID) | ORAL | 0 refills | Status: AC

## 2019-08-13 MED ORDER — FLUCONAZOLE 150 MG PO TABS: 150.0000 mg | ORAL_TABLET | Freq: Once | ORAL | 0 refills | Status: AC

## 2019-08-13 NOTE — Discharge Instructions (Signed)
We will repeat swab to check odor and itching  May begin using Flagyl twice daily for the next week to treat for BV.  Do not drink alcohol until 24 hours after taking last tablet  May take 1 tablet of Diflucan today, may repeat at completion of Flagyl.  We will be in contact with results, please continue to monitor, follow-up if changing or worsening

## 2019-08-13 NOTE — ED Triage Notes (Signed)
Pt states that she was diagnosed with bv, trichomonas and yeast at the end of July. Reports taking all of her medication and continues to have vaginal itching and odor. Denies any sexual intercourse since diagnosis.

## 2019-08-13 NOTE — ED Provider Notes (Signed)
[facility]    CSN: [id number] Arrival date & time: 08/13/19  1008      History   Chief Complaint Chief Complaint  Patient presents with  . Vaginal Itching    HPI Emily Poole is a 23 y.o. female history of hypertension presenting today for evaluation of vaginal odor and itching.  Patient was diagnosed with trichomoniasis, yeast and BV on 7/28.  She took a course of Flagyl as well as Diflucan.  She states that she has been on her menstrual cycle for the past month and that recently ended and she notes that she still has itching and odor.  Uses Nexplanon and often has long irregular cycles.  She has not had intercourse since previous treatment.  She denies abdominal pain.  Denies back pain.  Denies fever nausea vomiting.  Denies any rashes or lesions.  Has had history of recurrent BV and yeast.  HPI  Past Medical History:  Diagnosis Date  . Cervical condyloma   . Chlamydia   . Hypertension   . Pregnancy affected by intrauterine growth retardation (IUGR)     Patient Active Problem List   Diagnosis Date Noted  . IUGR (intrauterine growth restriction) 12/24/2014  . Hypertension affecting pregnancy in second trimester, antepartum   . Small for gestational age fetus   . IUGR (intrauterine growth restriction) affecting care of mother   . [redacted] weeks gestation of pregnancy   . Gestational hypertension 12/14/2014  . 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 Suspected fetal anomaly, antepartum   . 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 Supervision of normal first teen pregnancy 08/24/2014    Past Surgical History:  Procedure Laterality Date  . CESAREAN SECTION N/A 12/25/2014   Procedure: CESAREAN SECTION;  Surgeon: Lesly DukesKelly H Leggett, MD;  Location: [facility];  Service: Obstetrics;  Laterality: N/A;  . WISDOM TOOTH EXTRACTION      OB History    Gravida  1   Para  1   Term      Preterm  1   AB      Living  1     SAB      TAB      Ectopic      Multiple  0   Live Births  1             Home Medications    Prior to Admission medications   Medication Sig Start Date End Date Taking? Authorizing Provider  fluconazole (DIFLUCAN) 150 MG tablet Take 1 tablet (150 mg total) by mouth once for 1 dose. 08/13/19 08/13/19  Wieters, Hallie C, PA-C  metroNIDAZOLE (FLAGYL) 500 MG tablet Take 1 tablet (500 mg total) by mouth 2 (two) times daily for 7 days. 08/13/19 08/20/19  Wieters, Junius CreamerHallie C, PA-C    Family History Family History  Problem Relation Age of Onset  . Diabetes Father   . Hypertension Mother     Social History Social History   Tobacco Use  . Smoking status: Never Smoker  . Smokeless tobacco: Never Used  Substance Use Topics  . Alcohol use: No  . Drug use: No     Allergies   Patient has no known allergies.   Review of Systems Review of Systems  Constitutional: Negative for fever.  Respiratory: Negative for shortness of breath.   Cardiovascular: Negative for chest pain.  Gastrointestinal: Negative for abdominal pain, diarrhea, nausea and vomiting.  Genitourinary: Negative for dysuria, flank pain, genital sores, hematuria, menstrual problem, vaginal bleeding, vaginal discharge and  vaginal pain.  Musculoskeletal: Negative for back pain.  Skin: Negative for rash.  Neurological: Negative for dizziness, light-headedness and headaches.     Physical Exam Triage Vital Signs ED Triage Vitals  Enc Vitals Group     BP 08/13/19 1025 127/75     Pulse Rate 08/13/19 1025 68     Resp 08/13/19 1025 16     Temp 08/13/19 1025 97.8 F (36.6 C)     Temp Source 08/13/19 1025 Temporal     SpO2 08/13/19 1025 100 %     Weight --      Height --      Head Circumference --      Peak Flow --      Pain Score 08/13/19 1029 0     Pain Loc --      Pain Edu? --      Excl. in [place]? --    No data found.  Updated Vital Signs BP 127/75 (BP Location: Left Arm)   Pulse 68   Temp 97.8 F (36.6 C) (Temporal)   Resp 16   LMP 08/09/2019   SpO2 100%   Visual Acuity  Right Eye Distance:   Left Eye Distance:   Bilateral Distance:    Right Eye Near:   Left Eye Near:    Bilateral Near:     Physical Exam Vitals signs and nursing note reviewed.  Constitutional:      Appearance: She is well-developed.     Comments: No acute distress  HENT:     Head: Normocephalic and atraumatic.     Nose: Nose normal.  Eyes:     Conjunctiva/sclera: Conjunctivae normal.  Neck:     Musculoskeletal: Neck supple.  Cardiovascular:     Rate and Rhythm: Normal rate.  Pulmonary:     Effort: Pulmonary effort is normal. No respiratory distress.  Abdominal:     General: There is no distension.     Comments: nontender to light and deep palpation throughout abdomen  Musculoskeletal: Normal range of motion.  Skin:    General: Skin is warm and dry.  Neurological:     Mental Status: She is alert and oriented to person, place, and time.      UC Treatments / Results  Labs (all labs ordered are listed, but only abnormal results are displayed) Labs Reviewed  CERVICOVAGINAL ANCILLARY ONLY    EKG   Radiology No results found.  Procedures Procedures (including critical care time)  Medications Ordered in UC Medications - No data to display  Initial Impression / Assessment and Plan / UC Course  I have reviewed the triage vital signs and the nursing notes.  Pertinent labs & imaging results that were available during my care of the patient were reviewed by me and considered in my medical decision making (see chart for details).     Empirically treat for yeast and BV with Diflucan and metronidazole today.  Swab obtained to her confirm resolution of trichomonas as well as recheck STDs and confirm cause of odor/itching.  Discussed use of probiotics and preventing recurrent BV.Discussed strict return precautions. Patient verbalized understanding and is agreeable with plan.  Final Clinical Impressions(s) / UC Diagnoses   Final diagnoses:  Vaginitis and  vulvovaginitis     Discharge Instructions     We will repeat swab to check odor and itching  May begin using Flagyl twice daily for the next week to treat for BV.  Do not drink alcohol until 24 hours after taking last  tablet  May take 1 tablet of Diflucan today, may repeat at completion of Flagyl.  We will be in contact with results, please continue to monitor, follow-up if changing or worsening   ED Prescriptions    Medication Sig Dispense Auth. Provider   metroNIDAZOLE (FLAGYL) 500 MG tablet Take 1 tablet (500 mg total) by mouth 2 (two) times daily for 7 days. 14 tablet Wieters, Hallie C, PA-C   fluconazole (DIFLUCAN) 150 MG tablet Take 1 tablet (150 mg total) by mouth once for 1 dose. 2 tablet Wieters, Hallie C, PA-C     Controlled Substance Prescriptions [place] Controlled Substance Registry consulted? Not Applicable   Lew Dawes, [place] 08/13/19 1103

## 2019-08-15 LAB — CERVICOVAGINAL ANCILLARY ONLY
Bacterial vaginitis: POSITIVE — AB
Candida vaginitis: POSITIVE — AB
Chlamydia: NEGATIVE
Neisseria Gonorrhea: NEGATIVE
Trichomonas: POSITIVE — AB

## 2019-08-18 ENCOUNTER — Telehealth (HOSPITAL_COMMUNITY): Payer: Self-pay | Admitting: Emergency Medicine

## 2019-08-18 NOTE — Telephone Encounter (Signed)
Bacterial Vaginosis test is positive.  Prescription for metronidazole was given at the urgent care visit. Pt contacted regarding results. Answered all questions. Verbalized understanding. Candida (yeast) is positive.  Prescription for fluconazole was given at the urgent care visit.   Trichomonas is positive. Rx metronidazole was given at the urgent care visit. Pt needs education to please refrain from sexual intercourse for 7 days to give the medicine time to work. Sexual partners need to be notified and tested/treated. Condoms may reduce risk of reinfection. Recheck for further evaluation if symptoms are not improving.   Patient contacted and made aware of    results, all questions answered

## 2019-10-22 ENCOUNTER — Ambulatory Visit (HOSPITAL_COMMUNITY)
Admission: EM | Admit: 2019-10-22 | Discharge: 2019-10-22 | Disposition: A | Discharge status: Home / Self Care | Payer: Medicaid Other | Attending: Emergency Medicine | Admitting: Emergency Medicine

## 2019-10-22 ENCOUNTER — Other Ambulatory Visit: Payer: Self-pay

## 2019-10-22 ENCOUNTER — Encounter (HOSPITAL_COMMUNITY): Payer: Self-pay | Admitting: Emergency Medicine

## 2019-10-22 DIAGNOSIS — Z3202 Encounter for pregnancy test, result negative: Secondary | ICD-10-CM

## 2019-10-22 DIAGNOSIS — N760 Acute vaginitis: Secondary | ICD-10-CM | POA: Insufficient documentation

## 2019-10-22 DIAGNOSIS — B009 Herpesviral infection, unspecified: Secondary | ICD-10-CM | POA: Insufficient documentation

## 2019-10-22 LAB — POCT PREGNANCY, URINE: Preg Test, Ur: NEGATIVE

## 2019-10-22 MED ORDER — VALACYCLOVIR HCL 1 G PO TABS: 1000.0000 mg | ORAL_TABLET | Freq: Three times a day (TID) | ORAL | 0 refills | Status: AC

## 2019-10-22 MED ORDER — METRONIDAZOLE 500 MG PO TABS: 500.0000 mg | ORAL_TABLET | Freq: Two times a day (BID) | ORAL | 0 refills | Status: AC

## 2019-10-22 MED ORDER — FLUCONAZOLE 150 MG PO TABS: 150.0000 mg | ORAL_TABLET | Freq: Once | ORAL | 0 refills | Status: AC

## 2019-10-22 NOTE — ED Provider Notes (Signed)
[facility]    CSN: [id number] Arrival date & time: 10/22/19  [id number]      History   Chief Complaint No chief complaint on file. Vaginal Discharge  HPI Emily Poole is a 23 y.o. female presenting today for evaluation of vaginal discharge and irritation.  Patient states that over the past couple days she has had vaginal discharge.  Feels similar to when she has previously had yeast and BV.  Has had some itching.  She also notes that she has a sore to her perineal area that is very sensitive.  Notes that she has had herpes in the past.  Feels similar.  Denies rectal pain.  She denies any fevers, nausea or vomiting.  Denies abdominal pain.  She was last here on 9/09 end symptoms fully resolved after treatment for yeast BV and trichomoniasis.  She has not been sexually active since.  HPI  Past Medical History:  Diagnosis Date  . Cervical condyloma   . Chlamydia   . Hypertension   . Pregnancy affected by intrauterine growth retardation (IUGR)     Patient Active Problem List   Diagnosis Date Noted  . IUGR (intrauterine growth restriction) 12/24/2014  . Hypertension affecting pregnancy in second trimester, antepartum   . Small for gestational age fetus   . IUGR (intrauterine growth restriction) affecting care of mother   . [redacted] weeks gestation of pregnancy   . Gestational hypertension 12/14/2014  . 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 Suspected fetal anomaly, antepartum   . 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 Supervision of normal first teen pregnancy 08/24/2014    Past Surgical History:  Procedure Laterality Date  . CESAREAN SECTION N/A 12/25/2014   Procedure: CESAREAN SECTION;  Surgeon: Lesly DukesKelly H Leggett, MD;  Location: [facility];  Service: Obstetrics;  Laterality: N/A;  . WISDOM TOOTH EXTRACTION      OB History    Gravida  1   Para  1   Term      Preterm  1   AB      Living  1     SAB      TAB      Ectopic      Multiple  0   Live Births  1             Home Medications    Prior to Admission medications   Medication Sig Start Date End Date Taking? Authorizing Provider  fluconazole (DIFLUCAN) 150 MG tablet Take 1 tablet (150 mg total) by mouth once for 1 dose. 10/22/19 10/22/19  Poole, Emily C, PA-C  metroNIDAZOLE (FLAGYL) 500 MG tablet Take 1 tablet (500 mg total) by mouth 2 (two) times daily for 7 days. 10/22/19 10/29/19  Poole, Emily C, PA-C  valACYclovir (VALTREX) 1000 MG tablet Take 1 tablet (1,000 mg total) by mouth 3 (three) times daily for 14 days. 10/22/19 11/05/19  Poole, Junius CreamerHallie C, PA-C    Family History Family History  Problem Relation Age of Onset  . Diabetes Father   . Hypertension Mother     Social History Social History   Tobacco Use  . Smoking status: Never Smoker  . Smokeless tobacco: Never Used  Substance Use Topics  . Alcohol use: Yes  . Drug use: No     Allergies   Patient has no known allergies.   Review of Systems Review of Systems  Constitutional: Negative for fever.  Respiratory: Negative for shortness of breath.   Cardiovascular: Negative for chest pain.  Gastrointestinal:  Negative for abdominal pain, diarrhea, nausea and vomiting.  Genitourinary: Positive for genital sores and vaginal discharge. Negative for dysuria, flank pain, hematuria, menstrual problem, vaginal bleeding and vaginal pain.  Musculoskeletal: Negative for back pain.  Skin: Negative for rash.  Neurological: Negative for dizziness, light-headedness and headaches.     Physical Exam Triage Vital Signs ED Triage Vitals  Enc Vitals Group     BP      Pulse      Resp      Temp      Temp src      SpO2      Weight      Height      Head Circumference      Peak Flow      Pain Score      Pain Loc      Pain Edu?      Excl. in [place]?    No data found.  Updated Vital Signs BP 128/79 (BP Location: Left Arm)   Pulse 74   Temp 98.8 F (37.1 C) (Oral)   Resp 16   SpO2 100%   Visual Acuity Right Eye Distance:    Left Eye Distance:   Bilateral Distance:    Right Eye Near:   Left Eye Near:    Bilateral Near:     Physical Exam Vitals signs and nursing note reviewed.  Constitutional:      General: She is not in acute distress.    Appearance: She is well-developed.  HENT:     Head: Normocephalic and atraumatic.  Eyes:     Conjunctiva/sclera: Conjunctivae normal.  Neck:     Musculoskeletal: Neck supple.  Cardiovascular:     Rate and Rhythm: Normal rate and regular rhythm.     Heart sounds: No murmur.  Pulmonary:     Effort: Pulmonary effort is normal. No respiratory distress.     Breath sounds: Normal breath sounds.  Abdominal:     Palpations: Abdomen is soft.     Tenderness: There is no abdominal tenderness.  Genitourinary:    Comments: Normal external female genitalia, small raw erythematous area noted to left lower labia/perineum, no lesions extending to rectum, no induration  Vaginal mucosa pink, whitish clearish discharge present, os appears slightly erythematous Skin:    General: Skin is warm and dry.  Neurological:     Mental Status: She is alert.      UC Treatments / Results  Labs (all labs ordered are listed, but only abnormal results are displayed) Labs Reviewed  POC URINE PREG, ED  POCT PREGNANCY, URINE  CERVICOVAGINAL ANCILLARY ONLY    EKG   Radiology No results found.  Procedures Procedures (including critical care time)  Medications Ordered in UC Medications - No data to display  Initial Impression / Assessment and Plan / UC Course  I have reviewed the triage vital signs and the nursing notes.  Pertinent labs & imaging results that were available during my care of the patient were reviewed by me and considered in my medical decision making (see chart for details).     We'll repeat treatment for yeast and BV given recheck.  Petra Kuba of her symptoms.  Past 2 swabs have been positive for this.  She has also been positive for trichomonas twice in the past  despite no sexual activity since initial positive.  We'll check for resolution of this.  Would consider if continuing to be positive trying tinidazole follow-up as alternative.  Proceeding with Flagyl and Diflucan  today.  Lesion/ulcer to genital area likely HSV.  Initiating on Valtrex.  No signs of abscess at this time.  Continue to monitor.Discussed strict return precautions. Patient verbalized understanding and is agreeable with plan.  Final Clinical Impressions(s) / UC Diagnoses   Final diagnoses:  Vaginitis and vulvovaginitis  HSV (herpes simplex virus) infection     Discharge Instructions     Please begin taking metronidazole twice daily for the next week.  Do not drink alcohol while taking.  Take with food. Take 1 tablet of Diflucan today, may repeat after completing antibiotics if still having symptoms. Begin Valtrex 3 times a day for the next 7 to 10 days until lesion resolves Please continue to monitor sore and follow-up with symptoms changing, developing increased swelling pain or drainage    ED Prescriptions    Medication Sig Dispense Auth. Provider   valACYclovir (VALTREX) 1000 MG tablet Take 1 tablet (1,000 mg total) by mouth 3 (three) times daily for 14 days. 42 tablet Poole, Emily C, PA-C   metroNIDAZOLE (FLAGYL) 500 MG tablet Take 1 tablet (500 mg total) by mouth 2 (two) times daily for 7 days. 14 tablet Poole, Emily C, PA-C   fluconazole (DIFLUCAN) 150 MG tablet Take 1 tablet (150 mg total) by mouth once for 1 dose. 2 tablet Poole, [place] C, PA-C     PDMP not reviewed this encounter.   Lew Dawes, [place] 10/22/19 [phone]

## 2019-10-22 NOTE — Discharge Instructions (Signed)
Please begin taking metronidazole twice daily for the next week.  Do not drink alcohol while taking.  Take with food. Take 1 tablet of Diflucan today, may repeat after completing antibiotics if still having symptoms. Begin Valtrex 3 times a day for the next 7 to 10 days until lesion resolves Please continue to monitor sore and follow-up with symptoms changing, developing increased swelling pain or drainage

## 2019-10-22 NOTE — ED Triage Notes (Signed)
Patient is concerned for yeast infection and BV.  Patient reports a sore and does report having one outbreak of herpes in the past.  Patient does have vaginal discharge.  Patient does not have any abdominal or back pain and no pain with urination.

## 2019-10-24 ENCOUNTER — Telehealth: Payer: Self-pay | Admitting: Emergency Medicine

## 2019-10-24 LAB — CERVICOVAGINAL ANCILLARY ONLY
Bacterial vaginitis: POSITIVE — AB
Candida vaginitis: POSITIVE — AB
Chlamydia: NEGATIVE
Neisseria Gonorrhea: NEGATIVE
Trichomonas: POSITIVE — AB

## 2019-10-24 NOTE — Telephone Encounter (Signed)
Bacterial Vaginosis test is positive.  Prescription for metronidazole was given at the urgent care visit.  Candida (yeast) is positive.  Prescription for fluconazole was given at the urgent care visit.    Trich also treated.   Attempted to reach patient. Mother answered and stated she would have her call back.

## 2019-12-18 ENCOUNTER — Ambulatory Visit (HOSPITAL_COMMUNITY)
Admission: EM | Admit: 2019-12-18 | Discharge: 2019-12-18 | Disposition: A | Discharge status: Home / Self Care | Payer: Medicaid Other | Attending: Family Medicine | Admitting: Family Medicine

## 2019-12-18 ENCOUNTER — Other Ambulatory Visit: Payer: Self-pay

## 2019-12-18 NOTE — ED Notes (Signed)
Arline Asp, patient access, reports patient left, cussing on phone.  No interaction with staff in regards to leaving

## 2020-07-04 ENCOUNTER — Ambulatory Visit (HOSPITAL_COMMUNITY)
Admission: EM | Admit: 2020-07-04 | Discharge: 2020-07-04 | Disposition: A | Discharge status: Home / Self Care | Payer: Medicaid Other | Attending: Physician Assistant | Admitting: Physician Assistant

## 2020-07-04 ENCOUNTER — Other Ambulatory Visit: Payer: Self-pay

## 2020-07-04 ENCOUNTER — Encounter (HOSPITAL_COMMUNITY): Payer: Self-pay

## 2020-07-04 DIAGNOSIS — N760 Acute vaginitis: Secondary | ICD-10-CM | POA: Insufficient documentation

## 2020-07-04 DIAGNOSIS — N390 Urinary tract infection, site not specified: Secondary | ICD-10-CM

## 2020-07-04 DIAGNOSIS — Z3202 Encounter for pregnancy test, result negative: Secondary | ICD-10-CM | POA: Diagnosis not present

## 2020-07-04 LAB — POCT URINALYSIS DIP (DEVICE)
Bilirubin Urine: NEGATIVE
Glucose, UA: NEGATIVE mg/dL
Hgb urine dipstick: NEGATIVE
Ketones, ur: NEGATIVE mg/dL
Leukocytes,Ua: NEGATIVE
Nitrite: NEGATIVE
Protein, ur: NEGATIVE mg/dL
Specific Gravity, Urine: 1.025 (ref 1.005–1.030)
Urobilinogen, UA: 2 mg/dL — ABNORMAL HIGH (ref 0.0–1.0)
pH: 7 (ref 5.0–8.0)

## 2020-07-04 LAB — POC URINE PREG, ED: Preg Test, Ur: NEGATIVE

## 2020-07-04 MED ORDER — METRONIDAZOLE 500 MG PO TABS
500.0000 mg | ORAL_TABLET | Freq: Two times a day (BID) | ORAL | 0 refills | Status: DC
Start: 2020-07-04 — End: 2021-02-11

## 2020-07-04 NOTE — ED Triage Notes (Signed)
Pt c/o pelvic pain, pressure with urination, urine urgency, white thick discharge, dysuriax1.5 wks

## 2020-07-04 NOTE — Discharge Instructions (Signed)
We have sent your lab tests, we will call if we need to change treatments  Take medication as prescribed  Follow up with Ob/gyn and your PCP  If severe belly pain or pelvic pain, high fever, go to the Emergency room

## 2020-07-04 NOTE — ED Provider Notes (Addendum)
[facility]    CSN: [id number] Arrival date & time: 07/04/20  1005      History   Chief Complaint Chief Complaint  Patient presents with   Urinary Tract Infection    HPI Emily Poole is a 24 y.o. female.   Patient reports for bladder pressure and pain with urination for about a week and a half.  She reports pain and pressure in her lower abdomen with urination.  She endorses possible increase in frequency.  No urgency.  She denies fever, chills, back pain, nausea or vomiting.  She does endorse over the last 2 days she has developed a thick white discharge.  She is also had a little vaginal irritation and itching in the same timeframe of the discharge.  Cannot attribute a specific odor.  Denies pelvic pain or lower abdominal pain outside of episodes of urination.  No known exposures to STIs.       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   Suspected fetal anomaly, antepartum    Cervical condyloma 09/22/2014   Supervision of normal first teen pregnancy 08/24/2014    Past Surgical History:  Procedure Laterality Date   CESAREAN SECTION N/A 12/25/2014   Procedure: CESAREAN SECTION;  Surgeon: Lesly Dukes, MD;  Location: [facility];  Service: Obstetrics;  Laterality: N/A;   WISDOM TOOTH EXTRACTION      OB History    Gravida  1   Para  1   Term      Preterm  1   AB      Living  1     SAB      TAB      Ectopic      Multiple  0   Live Births    1            Home Medications    Prior to Admission medications   Medication Sig Start Date End Date Taking? Authorizing Provider  metroNIDAZOLE (FLAGYL) 500 MG tablet Take 1 tablet (500 mg total) by mouth 2 (two) times daily. 07/04/20   Idell Hissong, Veryl Speak, PA-C    Family History Family History  Problem Relation Age of Onset   Diabetes Father    Hypertension Mother     Social History Social History   Tobacco Use   Smoking status: Never Smoker   Smokeless tobacco: Never Used  Building services engineer Use: Never used  Substance Use Topics   Alcohol use: Yes    Comment: occ   Drug use: No     Allergies   Patient has no known allergies.   Review of Systems Review of Systems   Physical Exam Triage Vital Signs ED Triage Vitals  Enc Vitals Group     BP      Pulse      Resp      Temp      Temp src      SpO2      Weight  Height      Head Circumference      Peak Flow      Pain Score      Pain Loc      Pain Edu?      Excl. in GC?    No data found.  Updated Vital Signs BP (!) 114/51    Pulse 74    Temp 97.6 F (36.4 C) (Oral)    Resp 16    Ht 5' 4.5" (1.638 m)    Wt 125 lb (56.7 kg)    SpO2 100%    BMI 21.12 kg/m   Visual Acuity Right Eye Distance:   Left Eye Distance:   Bilateral Distance:    Right Eye Near:   Left Eye Near:    Bilateral Near:     Physical Exam Vitals and nursing note reviewed. Exam conducted with a chaperone present.  Constitutional:      General: She is not in acute distress.    Appearance: She is well-developed.  HENT:     Head: Normocephalic and atraumatic.  Eyes:     Conjunctiva/sclera: Conjunctivae normal.  Cardiovascular:     Rate and Rhythm: Normal rate.  Pulmonary:     Effort: Pulmonary effort is normal. No respiratory distress.  Abdominal:     Palpations: Abdomen is soft.     Tenderness: There is abdominal tenderness (suprapubic). There is no right CVA tenderness or left CVA tenderness.  Genitourinary:     General: Normal vulva.     Exam position: Lithotomy position.     Pubic Area: No rash.      Labia:        Right: No rash.        Left: No rash.      Vagina: Vaginal discharge and erythema present. No bleeding.     Cervix: No cervical motion tenderness, friability, erythema or cervical bleeding.     Uterus: Normal.      Adnexa:        Right: No tenderness.         Left: No tenderness.    Musculoskeletal:     Cervical back: Neck supple.  Skin:    General: Skin is warm and dry.  Neurological:     Mental Status: She is alert.      UC Treatments / Results  Labs (all labs ordered are listed, but only abnormal results are displayed) Labs Reviewed  POCT URINALYSIS DIP (DEVICE) - Abnormal; Notable for the following components:      Result Value   Urobilinogen, UA 2.0 (*)    All other components within normal limits  URINE CULTURE  POC URINE PREG, ED  CERVICOVAGINAL ANCILLARY ONLY    EKG   Radiology No results found.  Procedures Procedures (including critical care time)  Medications Ordered in UC Medications - No data to display  Initial Impression / Assessment and Plan / UC Course  I have reviewed the triage vital signs and the nursing notes.  Pertinent labs & imaging results that were available during my care of the patient were reviewed by me and considered in my medical decision making (see chart for details).     #Vaginitis Patient is a 24 year old with symptoms most consistent with vaginitis.  UA normal, will defer treatment of reported urinary symptoms pending culture.  Urine pregnancy negative.  Discharge seems consistent with BV, will initiate metronidazole.  Does have a history of BV and trichomonas last year, both treated.  Cervicovaginal swab  sent.  Low suspicion for PID.  We will have her follow-up with an OB/GYN to establish care.  Return and follow-up precautions discussed.  Patient verbalized understanding Final Clinical Impressions(s) / UC Diagnoses    Final diagnoses:  Vaginitis and vulvovaginitis     Discharge Instructions     We have sent your lab tests, we will call if we need to change treatments  Take medication as prescribed  Follow up with Ob/gyn and your PCP  If severe belly pain or pelvic pain, high fever, go to the Emergency room      ED Prescriptions    Medication Sig Dispense Auth. Provider   metroNIDAZOLE (FLAGYL) 500 MG tablet Take 1 tablet (500 mg total) by mouth 2 (two) times daily. 14 tablet Earmon Sherrow, Veryl Speak, PA-C     PDMP not reviewed this encounter.   Hermelinda Medicus, PA-C 07/04/20 1115    Creta Dorame, Veryl Speak, PA-C 07/04/20 1115

## 2020-07-05 LAB — CERVICOVAGINAL ANCILLARY ONLY
Bacterial Vaginitis (gardnerella): POSITIVE — AB
Candida Glabrata: NEGATIVE
Candida Vaginitis: POSITIVE — AB
Chlamydia: NEGATIVE
Comment: NEGATIVE
Comment: NEGATIVE
Comment: NEGATIVE
Comment: NEGATIVE
Comment: NEGATIVE
Comment: NORMAL
Neisseria Gonorrhea: NEGATIVE
Trichomonas: NEGATIVE

## 2020-07-05 LAB — URINE CULTURE: Culture: NO GROWTH

## 2020-07-06 ENCOUNTER — Telehealth (HOSPITAL_COMMUNITY): Payer: Self-pay

## 2020-07-06 MED ORDER — FLUCONAZOLE 150 MG PO TABS
150.0000 mg | ORAL_TABLET | Freq: Every day | ORAL | 0 refills | Status: AC
Start: 2020-07-06 — End: 2020-07-08

## 2020-09-09 ENCOUNTER — Other Ambulatory Visit: Payer: Medicaid Other

## 2021-02-11 ENCOUNTER — Other Ambulatory Visit: Payer: Self-pay

## 2021-02-11 ENCOUNTER — Ambulatory Visit: Payer: Medicaid Other | Admitting: Certified Nurse Midwife

## 2021-02-11 ENCOUNTER — Encounter: Payer: Self-pay | Admitting: Certified Nurse Midwife

## 2021-02-11 VITALS — BP 119/74 | HR 82 | Resp 16 | Ht 63.5 in | Wt 124.0 lb

## 2021-02-11 DIAGNOSIS — Z3046 Encounter for surveillance of implantable subdermal contraceptive: Secondary | ICD-10-CM | POA: Diagnosis not present

## 2021-02-11 DIAGNOSIS — Z30017 Encounter for initial prescription of implantable subdermal contraceptive: Secondary | ICD-10-CM

## 2021-02-11 MED ORDER — ETONOGESTREL 68 MG ~~LOC~~ IMPL
68.0000 mg | DRUG_IMPLANT | Freq: Once | SUBCUTANEOUS | Status: AC
  Administered 2021-02-11: 68 mg via SUBCUTANEOUS

## 2021-02-11 NOTE — Progress Notes (Signed)
Pt wants Nexplanon out today- undecided about birth control

## 2021-02-11 NOTE — Progress Notes (Signed)
   GYNECOLOGY OFFICE VISIT NOTE  History:  25 y.o. G1P0101 here today for Nexplanon removal which was placed 02/11/2018. She has been satisfied with it and would like a new one. She denies any abnormal vaginal discharge, bleeding, pelvic pain or other concerns.   Past Medical History:  Diagnosis Date  . Cervical condyloma   . Chlamydia   . Hypertension   . Pregnancy affected by intrauterine growth retardation (IUGR)     Past Surgical History:  Procedure Laterality Date  . CESAREAN SECTION N/A 12/25/2014   Procedure: CESAREAN SECTION;  Surgeon: Lesly Dukes, MD;  Location: [facility];  Service: Obstetrics;  Laterality: N/A;  . WISDOM TOOTH EXTRACTION      The following portions of the patient's history were reviewed and updated as appropriate: allergies, current medications, past family history, past medical history, past social history, past surgical history and problem list.   Health Maintenance:  No pap on file  Review of Systems:  Negative except noted in HPI  Objective:  Physical Exam BP 119/74   Pulse 82   Resp 16   Ht 5' 3.5" (1.613 m)   Wt 124 lb (56.2 kg)   Breastfeeding No   BMI 21.62 kg/m  CONSTITUTIONAL: Well-developed, well-nourished female in no acute distress.  HENT:  Normocephalic, atraumatic NECK: Normal range of motion SKIN: Skin is warm and dry NEUROLOGIC: Alert and oriented to person, place, and time PSYCHIATRIC: Normal mood and affect CARDIOVASCULAR: Normal heart rate noted RESPIRATORY: Effort and rate normal MUSCULOSKELETAL: Normal range of motion  Labs and Imaging No results found for this or any previous visit (from the past 24 hour(s)).  GYNECOLOGY PROCEDURE NOTE  Emily Poole is a 25 y.o. G1P0101 here for Nexplanon removal and reinsertion. No other gynecologic concerns.  Nexplanon Removal and Insertion  Patient identified, informed consent performed, consent signed.   Patient does understand that irregular bleeding is a very common side  effect of this medication. She was advised to have backup contraception for one week after replacement of the implant. Pregnancy test in clinic today was negative.  Appropriate time out taken. Implanon site identified. Area prepped in usual sterile fashon. One ml of 1% lidocaine was used to anesthetize the area at the distal end of the implant. A small stab incision was made right beside the implant on the distal portion. The Nexplanon rod was grasped using hemostats and removed without difficulty. There was minimal blood loss. There were no complications. Area was then injected with 3 ml of 1% lidocaine. She was re-prepped with betadine, Nexplanon removed from packaging, Device confirmed in needle, then inserted full length of needle and withdrawn per handbook instructions. Nexplanon was able to palpated in the patient's left arm; patient palpated the insert herself.  There was minimal blood loss. Patient insertion site covered with steri strips and gauze and a pressure bandage to reduce any bruising. The patient tolerated the procedure well and was given post procedure instructions.  She was advised to have backup contraception for one week.    Assessment & Plan:   1. Encounter for removal and reinsertion of Nexplanon     Follow up with GYN for annual and Pap  Donette Larry, CNM 24/10/2021 10:50 AM

## 2021-02-15 ENCOUNTER — Other Ambulatory Visit: Payer: Self-pay

## 2021-02-15 ENCOUNTER — Encounter (HOSPITAL_COMMUNITY): Payer: Self-pay

## 2021-02-15 ENCOUNTER — Emergency Department (HOSPITAL_COMMUNITY): Payer: Medicaid Other

## 2021-02-15 ENCOUNTER — Emergency Department (HOSPITAL_COMMUNITY)
Admission: EM | Admit: 2021-02-15 | Discharge: 2021-02-15 | Disposition: A | Discharge status: Home / Self Care | Payer: Medicaid Other | Attending: Emergency Medicine | Admitting: Emergency Medicine

## 2021-02-15 DIAGNOSIS — Z20822 Contact with and (suspected) exposure to covid-19: Secondary | ICD-10-CM | POA: Diagnosis not present

## 2021-02-15 DIAGNOSIS — I10 Essential (primary) hypertension: Secondary | ICD-10-CM | POA: Insufficient documentation

## 2021-02-15 DIAGNOSIS — F29 Unspecified psychosis not due to a substance or known physiological condition: Secondary | ICD-10-CM | POA: Diagnosis present

## 2021-02-15 DIAGNOSIS — F1010 Alcohol abuse, uncomplicated: Secondary | ICD-10-CM

## 2021-02-15 DIAGNOSIS — F22 Delusional disorders: Secondary | ICD-10-CM | POA: Insufficient documentation

## 2021-02-15 LAB — I-STAT BETA HCG BLOOD, ED (MC, WL, AP ONLY): I-stat hCG, quantitative: <5 m[IU]/mL (ref <5)

## 2021-02-15 LAB — CBC
HCT: 41.6 % (ref 36.0–46.0)
Hemoglobin: 13.6 g/dL (ref 12.0–15.0)
MCH: 30.7 pg (ref 26.0–34.0)
MCHC: 32.7 g/dL (ref 30.0–36.0)
MCV: 93.9 fL (ref 80.0–100.0)
Platelets: 246 10*3/uL (ref 150–400)
RBC: 4.43 MIL/uL (ref 3.87–5.11)
RDW: 12.8 % (ref 11.5–15.5)
WBC: 5.2 10*3/uL (ref 4.0–10.5)
nRBC: 0 % (ref 0.0–0.2)

## 2021-02-15 LAB — COMPREHENSIVE METABOLIC PANEL
ALT: 17 U/L (ref 0–44)
AST: 24 U/L (ref 15–41)
Albumin: 5 g/dL (ref 3.5–5.0)
Alkaline Phosphatase: 63 U/L (ref 38–126)
Anion gap: 13 (ref 5–15)
BUN: 11 mg/dL (ref 6–20)
CO2: 24 mmol/L (ref 22–32)
Calcium: 9.5 mg/dL (ref 8.9–10.3)
Chloride: 107 mmol/L (ref 98–111)
Creatinine, Ser: 0.94 mg/dL (ref 0.44–1.00)
GFR, Estimated: >60 mL/min (ref >60)
Glucose, Bld: 102 mg/dL — ABNORMAL HIGH (ref 70–99)
Potassium: 3.7 mmol/L (ref 3.5–5.1)
Sodium: 144 mmol/L (ref 135–145)
Total Bilirubin: 0.7 mg/dL (ref 0.3–1.2)
Total Protein: 8.8 g/dL — ABNORMAL HIGH (ref 6.5–8.1)

## 2021-02-15 LAB — RESP PANEL BY RT-PCR (FLU A&B, COVID) ARPGX2
Influenza A by PCR: NEGATIVE
Influenza B by PCR: NEGATIVE
SARS Coronavirus 2 by RT PCR: NEGATIVE

## 2021-02-15 LAB — ETHANOL: Alcohol, Ethyl (B): 193 mg/dL — ABNORMAL HIGH (ref <10)

## 2021-02-15 MED ORDER — STERILE WATER FOR INJECTION IJ SOLN
INTRAMUSCULAR | Status: AC
  Filled 2021-02-15: qty 10

## 2021-02-15 MED ORDER — ZIPRASIDONE MESYLATE 20 MG IM SOLR
20.0000 mg | Freq: Four times a day (QID) | INTRAMUSCULAR | Status: DC | PRN
  Administered 2021-02-15: 20 mg via INTRAMUSCULAR
  Filled 2021-02-15: qty 20

## 2021-02-15 MED ORDER — LORAZEPAM 1 MG PO TABS
1.0000 mg | ORAL_TABLET | ORAL | Status: DC | PRN
  Filled 2021-02-15: qty 1

## 2021-02-15 NOTE — ED Notes (Signed)
4 point violent restraints applied. Pt remains combative to staff, security and  [employer]

## 2021-02-15 NOTE — Patient Outreach (Signed)
CPSS met with Pt an were able to gain information to better assist Pt. CPSS processed with Pt about the situation at hand an what services are needed. Pt addressed the fact that she does have a drinking problem an that she needs help. CPSS contacted Outpatient services at the [facility]. CPSS left contact information with Parents for community assistance if needed.
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
## 2021-02-15 NOTE — ED Notes (Signed)
Pt is petite and is able to get out velcro violent restraints twice.  Security and [employer] assisted staff to apply soft restraints that could fit her wrist.  Violent restraints maintained on ankles.

## 2021-02-15 NOTE — ED Notes (Signed)
Pt belongings located in locker 29 

## 2021-02-15 NOTE — Discharge Instructions (Addendum)
Follow-up as instructed by behavioral health.  Refrain from drinking alcohol.  Return if any problems

## 2021-02-15 NOTE — BH Assessment (Signed)
Clinician contacted pt's EDP, Dr. Blinda Leatherwood, and nurses and nurse tech, Tabitha RN and [place] NT, regarding completing pt's CCA. Pt's EDP stated pt is not able to participate in her assessment at this time due to requiring sedation to assist in calming her. Clinician requested to be contacted when pt is able to participate in the assessment process.

## 2021-02-15 NOTE — ED Provider Notes (Signed)
[facility]-EMERGENCY DEPT Provider Note   CSN: [id number] Arrival date & time: 02/15/21  0043     History No chief complaint on file.   Emily Poole is a 25 y.o. female.  Patient brought to the emergency department in police custody after mother initiated involuntary commitment paperwork.  Patient has been extremely agitated and combative.  She is very delusional at arrival, states that she was at her mother's wedding today when she was arrested.  She also reports that she has a scheduled trip to [facility] with her boyfriend this week.  Mother has confirmed to the police that none of this is actually true.  Patient truly appears to believe this, however.  She is in cuffs because she has been extremely agitated during transport.  Patient very worked up at arrival.        Past Medical History:  Diagnosis Date  . Cervical condyloma   . Chlamydia   . Hypertension   . Pregnancy affected by intrauterine growth retardation (IUGR)     Patient Active Problem List   Diagnosis Date Noted  . Cervical condyloma 09/22/2014    Past Surgical History:  Procedure Laterality Date  . CESAREAN SECTION N/A 12/25/2014   Procedure: CESAREAN SECTION;  Surgeon: Lesly Dukes, MD;  Location: [facility];  Service: Obstetrics;  Laterality: N/A;  . WISDOM TOOTH EXTRACTION       OB History    Gravida  1   Para  1   Term      Preterm  1   AB      Living  1     SAB      IAB      Ectopic      Multiple  0   Live Births  1           Family History  Problem Relation Age of Onset  . Diabetes Father   . Hypertension Mother     Social History   Tobacco Use  . Smoking status: Never Smoker  . Smokeless tobacco: Never Used  Vaping Use  . Vaping Use: Never used  Substance Use Topics  . Alcohol use: Yes    Comment: occ  . Drug use: No    Home Medications Prior to Admission medications   Not on File    Allergies    Patient has no known  allergies.  Review of Systems   Review of Systems  Psychiatric/Behavioral: Positive for agitation and dysphoric mood.  All other systems reviewed and are negative.   Physical Exam Updated Vital Signs There were no vitals taken for this visit.  Physical Exam Vitals and nursing note reviewed.  Constitutional:      General: She is not in acute distress.    Appearance: Normal appearance. She is well-developed.  HENT:     Head: Normocephalic and atraumatic.     Right Ear: Hearing normal.     Left Ear: Hearing normal.     Nose: Nose normal.  Eyes:     Conjunctiva/sclera: Conjunctivae normal.     Pupils: Pupils are equal, round, and reactive to light.  Cardiovascular:     Rate and Rhythm: Regular rhythm.     Heart sounds: S1 normal and S2 normal. No murmur heard. No friction rub. No gallop.   Pulmonary:     Effort: Pulmonary effort is normal. No respiratory distress.     Breath sounds: Normal breath sounds.  Chest:     Chest  wall: No tenderness.  Abdominal:     General: Bowel sounds are normal.     Palpations: Abdomen is soft.     Tenderness: There is no abdominal tenderness. There is no guarding or rebound. Negative signs include Murphy's sign and McBurney's sign.     Hernia: No hernia is present.  Musculoskeletal:        General: Normal range of motion.     Cervical back: Normal range of motion and neck supple.  Skin:    General: Skin is warm and dry.     Findings: No rash.  Neurological:     Mental Status: She is alert and oriented to person, place, and time.     GCS: GCS eye subscore is 4. GCS verbal subscore is 5. GCS motor subscore is 6.     Cranial Nerves: No cranial nerve deficit.     Sensory: No sensory deficit.     Coordination: Coordination normal.  Psychiatric:        Speech: Speech is rapid and pressured and tangential.        Behavior: Behavior is agitated and aggressive.        Thought Content: Thought content is paranoid and delusional.     ED  Results / Procedures / Treatments   Labs (all labs ordered are listed, but only abnormal results are displayed) Labs Reviewed  RESP PANEL BY RT-PCR (FLU A&B, COVID) ARPGX2  CBC  COMPREHENSIVE METABOLIC PANEL  RAPID URINE DRUG SCREEN, HOSP PERFORMED  ETHANOL  I-STAT BETA HCG BLOOD, ED ([facility], [facility], [facility] ONLY)    EKG None  Radiology No results found.  Procedures Procedures   Medications Ordered in ED Medications  ziprasidone (GEODON) injection 20 mg (has no administration in time range)  LORazepam (ATIVAN) tablet 1 mg (has no administration in time range)  sterile water (preservative free) injection (has no administration in time range)    ED Course  I have reviewed the triage vital signs and the nursing notes.  Pertinent labs & imaging results that were available during my care of the patient were reviewed by me and considered in my medical decision making (see chart for details).    MDM Rules/Calculators/A&P                          Patient agitated, combative, delusional and paranoid at arrival.  No known psychiatric history.  Patient under involuntary commitment which has been initiated by her mother.  Patient will require restraints secondary to her violent behavior.  As needed medications ordered.  Will have psychiatric evaluation.  Final Clinical Impression(s) / ED Diagnoses Final diagnoses:  Psychosis, unspecified psychosis type [facility])    Rx / DC Orders ED Discharge Orders    None       Ananiah Maciolek, Canary Brim, MD 02/15/21 [phone]
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
## 2021-02-15 NOTE — ED Notes (Signed)
Pt wanded by security at this time. Pt changed into burgundy scrubs. Pt calm and cooperative at this time. Pt transported to TCU via WC with NT and security

## 2021-02-15 NOTE — BH Assessment (Signed)
Comprehensive Clinical Assessment (CCA) Note  02/15/2021 Emily Poole [id number]  Disposition: Per Assunta Found, NP, patient is psychiatrically. NP placed peer support consult and is requesting that ED hold patient until seen by peer suport due to patient not being able to return home and is now homeless.  Peer support to provide resources.   Emily Poole is a 25 year old female presenting to [facility] under IVC due to intoxication and aggression towards family. Per IVC "Respondent is hallucinating hearing voices and saying out loud that she is catching a flight. Respondent has become violent and stated that she wants to die. Respondent is not coherent and can not be reasoned with". IVC further reads in the description of findings "Patient acutely psychotic with hallucinations and delusions. Patient extremely agitated and combative requiring police to place in cuffs to prevent her from hurting others and herself. She thinks she was at her mother's wedding today and has a flight scheduled in 2 days to [facility], none of which is true".   Patient reports that she cannot remember what happened this morning. Patient reports that she went out drinking last night with a friend and she had three standard size beers and two shots of liquor (BAL 193). Patient also reported that she possibly smoked marijuana last night, but she does not remember (UDS pending). Patient reports that this has happened twice in her life where she was drinking and don't remember what happened afterwards. Patient states that it's like someone is slipping something in her drinks making her act out and forget what she did the next day. Patient reports being charged with 3 misdemeanors and a felony due to incident last time she was intoxicated. Patient has court on 3/31 for pending charges that happened in November 2021. Patient reports smoking marijuana daily and reports drinking one 12oz bottle of beer about three times a week. Patient denies  having a psychiatric history and has never received mental health outpatient/inpatient services. Patient is unemployed and currently lives at home with her mother, sister, niece and six-year-old daughter.   Patient consents for TTS to obtain collateral from her mother Wadie Lessen [phone] who reports that this is the second time patient has been aggressive and violent. Mom reports that patient is going to court from the last incident and was charged with a felony for spitting on a cop. Mom reports that patient did a couple days in jail for incident. Mom reports that patient came home last night disorganized and combative stating "they messing up my flight. I'm not going anywhere" as if she was at an airport. Mom reports that patient proceeded to take off her clothes followed by patient stating that she was not able to book her flight. Mom reports that patient started to become aggressive towards her and patient sister followed by her punching holes in the wall, destroying her sister room, breaking a television and throwing things in the kitchen. Mom reports that patient went into her daughter room and was attempting to wake her up. Mom said she feared for everyone safety so she locked patient daughter in the car so she would not get hurt and had others to go into their room. Mom reports calling [employer] to intervene however patient kept up aggressive behaviors for about 50 minutes and she could not be reasoned with or calmed down. Mom also reports smelling alcohol on patient this morning. Mom reports that this is out of patient normal behavior and state that patient is a calm, nice and  a helpful person. Mom reports that she noticed a change in patient after she had her daughter. Mom reports that patient seemed depressed, isolated, not eating and irritable. Mom also reports that patient has a hard time keeping jobs. Mom reports that patient got her birth control put back in her arm on Friday. Mom reports that  patient made suicidal statements of "I want to kill myself and I don't want to be here anymore about two weeks ago. However, mom did not mention that patient made those statements this morning. Mom reports that she does not feel safe with patient returning home and she can not return to her house and will need to find somewhere else to live.    Patient is oriented to person, place and situation. Patient is alert, engaged and cooperative. Patient eye contact and tone of voice is normal, patient thoughts are linier, her speech is appropriate, and she is calm but tearful during assessment. Patient denies SI and denies prior attempts. Patient denies HI, AVH and SIB.        The patient demonstrates the following risk factors for suicide: Chronic risk factors for suicide include: N/A. Acute risk factors for suicide include: family or marital conflict and unemployment. Protective factors for this patient include: positive social support, responsibility to others (children, family) and hope for the future. Considering these factors, the overall suicide risk at this point appears to be low. Patient is appropriate for outpatient follow up.  Flowsheet Row ED from 02/15/2021 in [facility]  C-SSRS RISK CATEGORY No Risk       Chief Complaint:  Chief Complaint  Patient presents with  . IVC   Visit Diagnosis: Psychosis, unspecified psychosis type    CCA Screening, Triage and Referral (STR)  Patient Reported Information How did you hear about [place]? No data recorded Referral name: No data recorded Referral phone number: No data recorded  Whom do you see for routine medical problems? No data recorded Practice/Facility Name: No data recorded Practice/Facility Phone Number: No data recorded Name of Contact: No data recorded Contact Number: No data recorded Contact Fax Number: No data recorded Prescriber Name: No data recorded Prescriber Address (if known): No data  recorded  What Is the Reason for Your Visit/Call Today? No data recorded How Long Has This Been Causing You Problems? No data recorded What Do You Feel Would Help You the Most Today? No data recorded  Have You Recently Been in Any Inpatient Treatment (Hospital/Detox/Crisis Center/28-Day Program)? No data recorded Name/Location of Program/Hospital:No data recorded How Long Were You There? No data recorded When Were You Discharged? No data recorded  Have You Ever Received Services From [facility] Before? No data recorded Who Do You See at [facility]? No data recorded  Have You Recently Had Any Thoughts About Hurting Yourself? No data recorded Are You Planning to Commit Suicide/Harm Yourself At This time? No data recorded  Have you Recently Had Thoughts About Hurting Someone Karolee Ohs? No data recorded Explanation: No data recorded  Have You Used Any Alcohol or Drugs in the Past 24 Hours? No data recorded How Long Ago Did You Use Drugs or Alcohol? No data recorded What Did You Use and How Much? No data recorded  Do You Currently Have a Therapist/Psychiatrist? No data recorded Name of Therapist/Psychiatrist: No data recorded  Have You Been Recently Discharged From Any Office Practice or Programs? No data recorded Explanation of Discharge From Practice/Program: No data recorded    CCA Screening  Triage Referral Assessment Type of Contact: No data recorded Is this Initial or Reassessment? No data recorded Date Telepsych consult ordered in [facility]:  No data recorded Time Telepsych consult ordered in [facility]:  No data recorded  Patient Reported Information Reviewed? No data recorded Patient Left Without Being Seen? No data recorded Reason for Not Completing Assessment: No data recorded  Collateral Involvement: No data recorded  Does Patient Have a Court Appointed Legal Guardian? No data recorded Name and Contact of Legal Guardian: No data recorded If Minor and Not Living with Parent(s), Who  has Custody? No data recorded Is CPS involved or ever been involved? No data recorded Is APS involved or ever been involved? No data recorded  Patient Determined To Be At Risk for Harm To Self or Others Based on Review of Patient Reported Information or Presenting Complaint? No data recorded Method: No data recorded Availability of Means: No data recorded Intent: No data recorded Notification Required: No data recorded Additional Information for Danger to Others Potential: No data recorded Additional Comments for Danger to Others Potential: No data recorded Are There Guns or Other Weapons in Your Home? No data recorded Types of Guns/Weapons: No data recorded Are These Weapons Safely Secured?                            No data recorded Who Could Verify You Are Able To Have These Secured: No data recorded Do You Have any Outstanding Charges, Pending Court Dates, Parole/Probation? No data recorded Contacted To Inform of Risk of Harm To Self or Others: No data recorded  Location of Assessment: No data recorded  Does Patient Present under Involuntary Commitment? No data recorded IVC Papers Initial File Date: No data recorded  [place] of Residence: No data recorded  Patient Currently Receiving the Following Services: No data recorded  Determination of Need: No data recorded  Options For Referral: No data recorded    CCA Biopsychosocial Intake/Chief Complaint:  Aggressive behaviors, intoxication  Current Symptoms/Problems: none   Patient Reported Schizophrenia/Schizoaffective Diagnosis in Past: No   Strengths: UTA  Preferences: UTA  Abilities: UTA   Type of Services Patient Feels are Needed: No data recorded  Initial Clinical Notes/Concerns: No data recorded  Mental Health Symptoms Depression:  Tearfulness; Irritability   Duration of Depressive symptoms: Greater than two weeks   Mania:  None   Anxiety:   Worrying   Psychosis:  None   Duration of Psychotic  symptoms: No data recorded  Trauma:  None   Obsessions:  None   Compulsions:  None   Inattention:  None   Hyperactivity/Impulsivity:  N/A   Oppositional/Defiant Behaviors:  None   Emotional Irregularity:  None   Other Mood/Personality Symptoms:  No data recorded   Mental Status Exam Appearance and self-care  Stature:  Small   Weight:  Thin   Clothing:  No data recorded  Grooming:  Normal   Cosmetic use:  Age appropriate   Posture/gait:  Normal   Motor activity:  Not Remarkable   Sensorium  Attention:  Normal   Concentration:  Normal   Orientation:  X5   Recall/memory:  Normal   Affect and Mood  Affect:  Tearful   Mood:  Depressed   Relating  Eye contact:  Normal   Facial expression:  Responsive; Sad   Attitude toward examiner:  Cooperative   Thought and Language  Speech flow: Clear and Coherent   Thought content:  Appropriate  to Mood and Circumstances   Preoccupation:  None   Hallucinations:  None   Organization:  No data recorded  [employer] ServicesExecutive Functions  Fund of Knowledge:  Good   Intelligence:  Average   Abstraction:  Normal   Judgement:  Good   Reality Testing:  Adequate   Insight:  Good   Decision Making:  Normal   Social Functioning  Social Maturity:  Responsible   Social Judgement:  Normal   Stress  Stressors:  Work; Housing; Family conflict   Coping Ability:  Normal   Skill Deficits:  None   Supports:  Family     Religion:    Leisure/Recreation:    Exercise/Diet:     CCA Employment/Education Employment/Work Situation: Employment / Work Situation Employment situation: Unemployed Patient's job has been impacted by current illness: No Where was the patient employed at that time?: [employer] Has patient ever been in the [employer] and Companymilitary?: No  Education: Education Is Patient Currently Attending School?: No   CCA Family/Childhood History Family and Relationship History: Family history What is your sexual  orientation?: UTA Has your sexual activity been affected by drugs, alcohol, medication, or emotional stress?: UTA Does patient have children?: Yes How many children?: 1  Childhood History:  Childhood History Additional childhood history information: UTA Description of patient's relationship with caregiver when they were a child: UTA Patient's description of current relationship with people who raised him/her: UTA How were you disciplined when you got in trouble as a child/adolescent?: UTA Does patient have siblings?: Yes  Child/Adolescent Assessment:     CCA Substance Use Alcohol/Drug Use: Alcohol / Drug Use Pain Medications: See MAR Prescriptions: See MAR Over the Counter: See MAR History of alcohol / drug use?: Yes Negative Consequences of Use: Legal Substance #1 Name of Substance 1: THC 1 - Amount (size/oz): 2 blunts 1 - Frequency: daily 1 - Duration: ongoing 1 - Last Use / Amount: 02/14/2021 Substance #2 Name of Substance 2: ETOH 2 - Amount (size/oz): 1 12oz beer 2 - Frequency: 3x week 2 - Duration: ongoing 2 - Last Use / Amount: 02/15/2021/ Three 12oz beer and two shots of liquor                     ASAM's:  Six Dimensions of Multidimensional Assessment  Dimension 1:  Acute Intoxication and/or Withdrawal Potential:      Dimension 2:  Biomedical Conditions and Complications:      Dimension 3:  Emotional, Behavioral, or Cognitive Conditions and Complications:     Dimension 4:  Readiness to Change:     Dimension 5:  Relapse, Continued use, or Continued Problem Potential:     Dimension 6:  Recovery/Living Environment:     ASAM Severity Score:    ASAM Recommended Level of Treatment: ASAM Recommended Level of Treatment: Level I Outpatient Treatment   Substance use Disorder (SUD) Substance Use Disorder (SUD)  Checklist Symptoms of Substance Use: Continued use despite persistent or recurrent social, interpersonal problems, caused or exacerbated by  use  Recommendations for Services/Supports/Treatments: Recommendations for Services/Supports/Treatments Recommendations For Services/Supports/Treatments: Individual Therapy  DSM5 Diagnoses: Patient Active Problem List   Diagnosis Date Noted  . 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 NP placed peer support consult and is requesting that ED hold patient until seen by peer suport due to patient not being able to return home and is now homeless.  Peer support to provide resources.   Nimai Burbach Shirlee MoreL Dale Ribeiro, [facility]

## 2021-02-15 NOTE — ED Notes (Signed)
Pt escorted by [employer] to restroom.  2 officers and Charity fundraiser by restroom. Pt changed into scrub pants and escorted back to room.  Pt is drowsy at this time.

## 2021-02-15 NOTE — BH Assessment (Signed)
Emily Ferns, RN notified through secure chat that per Assunta Found, NP, patient is psychiatrically. NP placed peer support consult and is requesting that ED hold patient until seen by peer suport due to patient not being able to return home and is now homeless.  Peer support to provide resources.

## 2021-02-22 ENCOUNTER — Other Ambulatory Visit (HOSPITAL_COMMUNITY)
Admission: RE | Admit: 2021-02-22 | Discharge: 2021-02-22 | Disposition: A | Discharge status: Home / Self Care | Payer: Medicaid Other | Source: Ambulatory Visit | Attending: Advanced Practice Midwife | Admitting: Advanced Practice Midwife

## 2021-02-22 ENCOUNTER — Ambulatory Visit: Payer: Medicaid Other | Admitting: Advanced Practice Midwife

## 2021-02-22 ENCOUNTER — Other Ambulatory Visit: Payer: Self-pay

## 2021-02-22 ENCOUNTER — Encounter: Payer: Self-pay | Admitting: Advanced Practice Midwife

## 2021-02-22 VITALS — BP 100/59 | HR 64 | Resp 16 | Ht 63.5 in | Wt 123.0 lb

## 2021-02-22 DIAGNOSIS — A630 Anogenital (venereal) warts: Secondary | ICD-10-CM

## 2021-02-22 DIAGNOSIS — Z01419 Encounter for gynecological examination (general) (routine) without abnormal findings: Secondary | ICD-10-CM | POA: Insufficient documentation

## 2021-02-22 DIAGNOSIS — Z803 Family history of malignant neoplasm of breast: Secondary | ICD-10-CM | POA: Insufficient documentation

## 2021-02-22 DIAGNOSIS — N898 Other specified noninflammatory disorders of vagina: Secondary | ICD-10-CM | POA: Insufficient documentation

## 2021-02-22 DIAGNOSIS — Z975 Presence of (intrauterine) contraceptive device: Secondary | ICD-10-CM | POA: Insufficient documentation

## 2021-02-22 DIAGNOSIS — N6320 Unspecified lump in the left breast, unspecified quadrant: Secondary | ICD-10-CM

## 2021-02-22 DIAGNOSIS — F1010 Alcohol abuse, uncomplicated: Secondary | ICD-10-CM | POA: Insufficient documentation

## 2021-02-22 DIAGNOSIS — N6310 Unspecified lump in the right breast, unspecified quadrant: Secondary | ICD-10-CM

## 2021-02-22 NOTE — Progress Notes (Signed)
Subjective:     DAWT REEB is a 25 y.o. female here at [facility] for a routine exam.  Current complaints: Vaginal discharge with slight odor.  Pt reports recent behavioral health evaluation and reports she drinks alcohol to excess and smokes marijuana.  She desires help and wants to speak with a counselor.  Personal health questionnaire reviewed: yes.  Do you have a primary care provider? Yes Do you feel safe at home? yes  Flowsheet Row US OB FOLLOW UP ADD'L GEST from 12/23/2014 [facility]  PHQ-2 Total Score 0      Risk factors for chronic health problems: Smoking: yes, discussed quitting, pt to work on abuse of alcohol first, and wants to address smoking as long term health risk Alchohol/how much: 12 oz beer 3 x /week plus binge drinking with 5 drinks at one time occasionally Pt BMI: Body mass index is 21.45 kg/m.   Gynecologic History No LMP recorded. Patient has had an implant. Contraception: Nexplanon Last Pap: unknown. Results were: normal Last mammogram: n/a  Obstetric History OB History  Gravida Para Term Preterm AB Living  1 1   1   1   SAB IAB Ectopic Multiple Live Births        0 1    # Outcome Date GA Lbr Len/2nd Weight Sex Delivery Anes PTL Lv  1 Preterm 12/25/14 [redacted]w[redacted]d  1 lb 12.2 oz (0.799 kg) F CS-LTranv Spinal  LIV     The following portions of the patient's history were reviewed and updated as appropriate: allergies, current medications, past family history, past medical history, past social history, past surgical history and problem list.  Review of Systems Pertinent items noted in HPI and remainder of comprehensive ROS otherwise negative.    Objective:   BP (!) 100/59   Pulse 64   Resp 16   Ht 5' 3.5" (1.613 m)   Wt 123 lb (55.8 kg)   BMI 21.45 kg/m  VS reviewed, nursing note reviewed,  Constitutional: well developed, well nourished, no distress HEENT: normocephalic CV: normal rate Pulm/chest wall:  normal effort Breast Exam:  performed: right breast normal with multiple small, smooth, mobile masses in upper outer quadrant, no skin or nipple changes or axillary nodes, left breast  normal with multiple small, smooth, mobile masses in upper outer quadrant, no skin or nipple changes or axillary nodes Abdomen: soft Neuro: alert and oriented x 3 Skin: warm, dry Psych: affect normal Pelvic exam: Performed: Cervix pink, visually closed, with single flat white lesion at 1 o'clock, scant white creamy discharge, vaginal walls and external genitalia normal Bimanual exam: Cervix 0/long/high, firm, anterior, neg CMT, uterus nontender, nonenlarged, adnexa without tenderness, enlargement, or mass       Assessment/Plan:   1. Alcohol abuse --Pt with recent ER evaluation in 02/15/21, was combative and delusional at that visit initially. Pt family called police and were worried about safety as pt was destructive to property and threatening.  No psychiatric history.  Pt today is pleasant, seeks help and wants to speak with counselor in our office and be referred to therapy outpatient.  - Ambulatory referral to Integrated Behavioral Health  2. Family history of breast cancer in first degree relative --Pt mother is a 2 time survivor of breast cancer, starting in her 30s. --Since small mobile breast masses palpable in bilateral breasts, will evaluate by 02/17/21 --Empower cancer screening test performed today - Genetic Screening - US BREAST COMPLETE UNI LEFT INC AXILLA; Future -  US BREAST LTD UNI RIGHT INC AXILLA; Future  3. Well woman exam with routine gynecological exam --No gyn concerns, no abnormal Pap hx - Cytology - PAP( [place])  4. Cervical condyloma --Small white patch on cervix, not c/w condyloma visually.  Will follow up as needed, Pap today pending.  5. Nexplanon in place   6. Vaginal discharge --Discussed prevention of vaginal infections today, swab pending. - Cervicovaginal ancillary  only( [place])  7. Masses of both breasts --Exam benign, c/w fibrocystic breast changes but given pt family hx of breast ca in a first degree relative at a young age, will perform [place]. - US BREAST COMPLETE UNI LEFT INC AXILLA; Future - US BREAST LTD UNI RIGHT INC AXILLA; Future     Follow up in: 3 months or as needed.   Sharen Counter, CNM 10:24 AM

## 2021-02-23 LAB — CERVICOVAGINAL ANCILLARY ONLY
Bacterial Vaginitis (gardnerella): POSITIVE — AB
Candida Glabrata: NEGATIVE
Candida Vaginitis: NEGATIVE
Chlamydia: NEGATIVE
Comment: NEGATIVE
Comment: NEGATIVE
Comment: NEGATIVE
Comment: NEGATIVE
Comment: NEGATIVE
Comment: NORMAL
Neisseria Gonorrhea: NEGATIVE
Trichomonas: NEGATIVE

## 2021-02-24 LAB — CYTOLOGY - PAP: Diagnosis: HIGH — AB

## 2021-02-28 ENCOUNTER — Telehealth: Payer: Self-pay | Admitting: *Deleted

## 2021-02-28 ENCOUNTER — Ambulatory Visit (HOSPITAL_COMMUNITY): Payer: Medicaid Other | Admitting: Behavioral Health

## 2021-02-28 NOTE — Telephone Encounter (Signed)
Left patient a message to call and schedule Colpo. °

## 2021-02-28 NOTE — Telephone Encounter (Signed)
-----   Message from Hurshel Party, CNM sent at 02/25/2021  6:03 PM EDT ----- Regarding: Pt needs colpo This pt had abnormal Pap on 02/22/21 with [facility]. She needs colposcopy scheduled with appropriate provider.  I sent her a MyChart message but please call her to schedule appointment.

## 2021-03-01 ENCOUNTER — Telehealth: Payer: Self-pay | Admitting: *Deleted

## 2021-03-01 NOTE — Telephone Encounter (Signed)
Left patient a message that the office will contact her again to try to assist with phone call to [facility].

## 2021-03-07 ENCOUNTER — Telehealth: Payer: Self-pay | Admitting: *Deleted

## 2021-03-07 ENCOUNTER — Encounter: Payer: Self-pay | Admitting: *Deleted

## 2021-03-07 NOTE — Telephone Encounter (Signed)
Copy of Neg Empower mailed to pt's home address and results forwarded to L Leftwich-Kirby,CNM

## 2021-03-08 ENCOUNTER — Other Ambulatory Visit: Payer: Self-pay

## 2021-03-08 ENCOUNTER — Ambulatory Visit (HOSPITAL_COMMUNITY): Payer: Medicaid Other | Admitting: Behavioral Health

## 2021-03-08 DIAGNOSIS — F1020 Alcohol dependence, uncomplicated: Secondary | ICD-10-CM

## 2021-03-08 NOTE — Progress Notes (Signed)
Emily Poole is a 25 year old female who presents to [facility] outpatient for a scheduled SAIOP intake appointment with this Clinical research associate. Client reports alcohol to be her drug(s) of choice. Client was assessed on 02/15/2021 and meets criteria for ASAM Level 2.1 treatment. Emily Poole is being recommended for Substance Abuse Intensive Outpatient Program [facility]). Client has agreed to attend SAIOP group therapy on Mondays, Wednesdays, and Fridays from 9:00am - 12:00pm. Group rules and group expectations were discussed with client during today's session. Client did not express any concerns regarding group rules and expectations.  Triggers: UKN   Probation/TASC: None  DWI Requirements: None     SAIOP Start Date:  03/11/2021

## 2021-03-11 ENCOUNTER — Ambulatory Visit (INDEPENDENT_AMBULATORY_CARE_PROVIDER_SITE_OTHER): Payer: Medicaid Other | Admitting: Behavioral Health

## 2021-03-11 ENCOUNTER — Other Ambulatory Visit: Payer: Self-pay

## 2021-03-11 DIAGNOSIS — F1020 Alcohol dependence, uncomplicated: Secondary | ICD-10-CM | POA: Diagnosis not present

## 2021-03-14 ENCOUNTER — Ambulatory Visit (HOSPITAL_COMMUNITY): Payer: Medicaid Other | Admitting: Behavioral Health

## 2021-03-14 NOTE — Group Note (Signed)
Group Topic: Boundaries  Group Date: 03/11/2021 Start Time:  9:00 AM End Time: 12:00 PM Facilitators: Mamie Nick, Counselor  Department: [facility]  Number of Participants: 6  Group Focus: acceptance, anxiety, check in, coping skills, depression, diagnosis education, family, feeling awareness/expression, forgiveness, loss/grief issues, personal responsibility, problem solving, and relapse prevention Treatment Modality:  Cognitive Behavioral Therapy Interventions utilized were clarification, confrontation, exploration, group exercise, and support Purpose: enhance coping skills, explore maladaptive thinking, express feelings, express irrational fears, improve communication skills, increase insight, regain self-worth, reinforce self-care, relapse prevention strategies, and trigger / craving management   Name: BIRDELLA SIPPEL Date of Birth: 06-Jul-1996  MR: [id number]    Level of Participation: moderate Quality of Participation: attentive, cooperative, motivated and offered feedback Interactions with others: gave feedback Mood/Affect: appropriate Triggers (if applicable): N/A Cognition: coherent/clear Progress: Gaining insight Response: Today is client's first day attending SAIOP group. Client checked in by sharing "what brought her to treatment". She shared that her use of alcohol was starting to cause her to have moments of rage and violence. She became tearful as she shared some of the things that happened during her drunken state. She shared that she never wants her family to see her like that again. Client shared that she plans to continue to work towards sobriety so she can be a role model for her 6yo daughter. Client rated her depression a 6-7 and her anxiety a 3, on a scale 0 to 10 with 10 being the worse. Plan: follow-up needed  Patients Problems:  Patient Active Problem List   Diagnosis Date Noted  . Nexplanon in place 02/22/2021  . Family history of  breast cancer in first degree relative 02/22/2021  . Alcohol abuse 02/22/2021  . 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 No   Name of family member(s): N/A  UDS collected: No Results: N/A  AA/NA attended?: No  Sponsor?: No

## 2021-03-16 ENCOUNTER — Other Ambulatory Visit: Payer: Self-pay

## 2021-03-16 ENCOUNTER — Ambulatory Visit (HOSPITAL_COMMUNITY): Payer: Medicaid Other | Admitting: Behavioral Health

## 2021-03-18 ENCOUNTER — Ambulatory Visit (HOSPITAL_COMMUNITY): Payer: Medicaid Other | Admitting: Behavioral Health

## 2021-03-18 ENCOUNTER — Other Ambulatory Visit: Payer: Self-pay

## 2021-03-21 ENCOUNTER — Ambulatory Visit (HOSPITAL_COMMUNITY): Payer: Self-pay

## 2021-03-21 ENCOUNTER — Encounter: Payer: Medicaid Other | Admitting: Obstetrics and Gynecology

## 2021-03-23 ENCOUNTER — Ambulatory Visit (HOSPITAL_COMMUNITY): Payer: Self-pay | Admitting: Behavioral Health

## 2021-03-25 ENCOUNTER — Ambulatory Visit (HOSPITAL_COMMUNITY): Payer: Self-pay | Admitting: Behavioral Health

## 2021-03-28 ENCOUNTER — Ambulatory Visit (HOSPITAL_COMMUNITY): Payer: Self-pay | Admitting: Behavioral Health

## 2021-03-30 ENCOUNTER — Ambulatory Visit (HOSPITAL_COMMUNITY): Payer: Medicaid Other | Admitting: Behavioral Health

## 2021-04-01 ENCOUNTER — Ambulatory Visit (HOSPITAL_COMMUNITY): Payer: Self-pay | Admitting: Behavioral Health

## 2021-04-04 ENCOUNTER — Ambulatory Visit (HOSPITAL_COMMUNITY): Payer: Self-pay

## 2021-04-05 ENCOUNTER — Other Ambulatory Visit: Payer: Self-pay

## 2021-04-05 ENCOUNTER — Encounter (HOSPITAL_COMMUNITY): Payer: Self-pay | Admitting: Emergency Medicine

## 2021-04-05 ENCOUNTER — Ambulatory Visit (HOSPITAL_COMMUNITY)
Admission: EM | Admit: 2021-04-05 | Discharge: 2021-04-05 | Disposition: A | Discharge status: Home / Self Care | Payer: Medicaid Other | Attending: Internal Medicine | Admitting: Internal Medicine

## 2021-04-05 DIAGNOSIS — N760 Acute vaginitis: Secondary | ICD-10-CM | POA: Insufficient documentation

## 2021-04-05 DIAGNOSIS — B9689 Other specified bacterial agents as the cause of diseases classified elsewhere: Secondary | ICD-10-CM | POA: Diagnosis present

## 2021-04-05 DIAGNOSIS — R300 Dysuria: Secondary | ICD-10-CM | POA: Insufficient documentation

## 2021-04-05 LAB — POCT URINALYSIS DIPSTICK, ED / UC
Bilirubin Urine: NEGATIVE
Glucose, UA: NEGATIVE mg/dL
Hgb urine dipstick: NEGATIVE
Nitrite: NEGATIVE
Protein, ur: NEGATIVE mg/dL
Specific Gravity, Urine: 1.025 (ref 1.005–1.030)
Urobilinogen, UA: 0.2 mg/dL (ref 0.0–1.0)
pH: 6 (ref 5.0–8.0)

## 2021-04-05 LAB — POC URINE PREG, ED: Preg Test, Ur: NEGATIVE

## 2021-04-05 MED ORDER — METRONIDAZOLE 500 MG PO TABS: 500.0000 mg | ORAL_TABLET | Freq: Two times a day (BID) | ORAL | 0 refills | Status: DC

## 2021-04-05 NOTE — ED Triage Notes (Signed)
Pt presents with abdominal pain and pressure, frequent urination and dysuria xs 1 week.

## 2021-04-05 NOTE — Discharge Instructions (Addendum)
Please take medications as prescribed Do not drink alcohol whilst taking metronidazole We will call you with recommendations if labs are abnormal Return to urgent care if symptoms worsen. Increase oral fluid intake

## 2021-04-06 ENCOUNTER — Telehealth: Payer: Self-pay | Admitting: Advanced Practice Midwife

## 2021-04-06 ENCOUNTER — Ambulatory Visit (HOSPITAL_COMMUNITY): Payer: Self-pay

## 2021-04-06 NOTE — Telephone Encounter (Signed)
Called pt today to discuss Empower test and costs. Pt insurance, [employer], will not cover test with current parameters, as her mother has not had BRCA testing so her daughter does not qualify for BRCA testing at this time.  Pt is to contact [employer], and was given contact information for [employer] Priscille Heidelberg today for options to reduce cost of the test. Pt states understanding and will contact [employer].

## 2021-04-07 ENCOUNTER — Telehealth (HOSPITAL_COMMUNITY): Payer: Self-pay | Admitting: Emergency Medicine

## 2021-04-07 ENCOUNTER — Telehealth: Payer: Self-pay | Admitting: *Deleted

## 2021-04-07 LAB — URINE CULTURE: Culture: 30000 — AB

## 2021-04-07 MED ORDER — NITROFURANTOIN MONOHYD MACRO 100 MG PO CAPS: 100.0000 mg | ORAL_CAPSULE | Freq: Two times a day (BID) | ORAL | 0 refills | Status: DC

## 2021-04-07 NOTE — ED Provider Notes (Addendum)
[facility]    CSN: [id number] Arrival date & time: 04/05/21  0818      History   Chief Complaint Chief Complaint  Patient presents with  . Abdominal Pain  . Urinary Frequency  . Dysuria    HPI Emily Poole is a 25 y.o. female to the urgent care with recent history of urinary frequency, lower abdominal pain and dysuria of 1 week duration.  Symptoms started insidiously and has been persistent.  Patient denies any fever or chills.  No flank pain.  No nausea or vomiting.  No diarrhea.  Patient endorses mild vaginal discharge with foul smelling odor. Patient was recently diagnosed with bacterial vaginosis but was only treated with vaginal suppositories. She said her symptoms have not improved.No perineal rash. Marland Kitchen   HPI  Past Medical History:  Diagnosis Date  . Cervical condyloma   . Chlamydia   . Hypertension   . Pregnancy affected by intrauterine growth retardation (IUGR)     Patient Active Problem List   Diagnosis Date Noted  . Nexplanon in place 02/22/2021  . Family history of breast cancer in first degree relative 02/22/2021  . Alcohol abuse 02/22/2021  . Cervical condyloma 09/22/2014    Past Surgical History:  Procedure Laterality Date  . CESAREAN SECTION N/A 12/25/2014   Procedure: CESAREAN SECTION;  Surgeon: Lesly Dukes, MD;  Location: [facility];  Service: Obstetrics;  Laterality: N/A;  . WISDOM TOOTH EXTRACTION      OB History    Gravida  1   Para  1   Term      Preterm  1   AB      Living  1     SAB      IAB      Ectopic      Multiple  0   Live Births  1            Home Medications    Prior to Admission medications   Medication Sig Start Date End Date Taking? Authorizing Provider  metroNIDAZOLE (FLAGYL) 500 MG tablet Take 1 tablet (500 mg total) by mouth 2 (two) times daily. 04/05/21  Yes Josefa Syracuse, Britta Mccreedy, MD  Etonogestrel [facility]) Inject into the skin.    [provider]  nitrofurantoin,  macrocrystal-monohydrate, (MACROBID) 100 MG capsule Take 1 capsule (100 mg total) by mouth 2 (two) times daily. 04/07/21   LampteyBritta Mccreedy, MD    Family History Family History  Problem Relation Age of Onset  . Diabetes Father   . Hypertension Mother     Social History Social History   Tobacco Use  . Smoking status: Never Smoker  . Smokeless tobacco: Never Used  Vaping Use  . Vaping Use: Never used  Substance Use Topics  . Alcohol use: Yes    Comment: occ  . Drug use: No     Allergies   Patient has no known allergies.   Review of Systems Review of Systems  Cardiovascular: Negative for chest pain and palpitations.  Gastrointestinal: Positive for abdominal pain. Negative for diarrhea, nausea and vomiting.  Genitourinary: Positive for dysuria, frequency, pelvic pain, urgency and vaginal discharge. Negative for difficulty urinating, flank pain, genital sores, hematuria, vaginal bleeding and vaginal pain.  Neurological: Negative.      Physical Exam Triage Vital Signs ED Triage Vitals  Enc Vitals Group     BP 04/05/21 0859 112/64     Pulse Rate 04/05/21 0859 63     Resp 04/05/21  0859 16     Temp 04/05/21 0859 97.9 F (36.6 C)     Temp Source 04/05/21 0859 Oral     SpO2 04/05/21 0859 100 %     Weight --      Height --      Head Circumference --      Peak Flow --      Pain Score 04/05/21 0857 7     Pain Loc --      Pain Edu? --      Excl. in GC? --    No data found.  Updated Vital Signs BP 112/64 (BP Location: Right Arm)   Pulse 63   Temp 97.9 F (36.6 C) (Oral)   Resp 16   LMP  (LMP Unknown)   SpO2 100%   Visual Acuity Right Eye Distance:   Left Eye Distance:   Bilateral Distance:    Right Eye Near:   Left Eye Near:    Bilateral Near:     Physical Exam Vitals and nursing note reviewed.  Constitutional:      Appearance: She is not ill-appearing.  Pulmonary:     Effort: Pulmonary effort is normal.     Breath sounds: Normal breath sounds.   Abdominal:     Palpations: Abdomen is soft. There is no shifting dullness or splenomegaly.     Tenderness: There is no abdominal tenderness.     Hernia: No hernia is present.  Neurological:     Mental Status: She is alert.      UC Treatments / Results  Labs (all labs ordered are listed, but only abnormal results are displayed) Labs Reviewed  URINE CULTURE - Abnormal; Notable for the following components:      Result Value   Culture 30,000 COLONIES/mL ESCHERICHIA COLI (*)    Organism ID, Bacteria ESCHERICHIA COLI (*)    All other components within normal limits  POCT URINALYSIS DIPSTICK, ED / UC - Abnormal; Notable for the following components:   Ketones, ur TRACE (*)    Leukocytes,Ua TRACE (*)    All other components within normal limits  POC URINE PREG, ED    EKG   Radiology No results found.  Procedures Procedures (including critical care time)  Medications Ordered in UC Medications - No data to display  Initial Impression / Assessment and Plan / UC Course  I have reviewed the triage vital signs and the nursing notes.  Pertinent labs & imaging results that were available during my care of the patient were reviewed by me and considered in my medical decision making (see chart for details).     1. Dysuria: POC urinalysis is significant for leucocyte esterase Urine cultures have been sent If cultures are positive, we will call patients with recommendations  2. Vaginitis likely BV: Metronidazole 500mg  BID for 7 days Return to urgent care if symptoms persist or worsens. Return precautions given. Final Clinical Impressions(s) / UC Diagnoses   Final diagnoses:  Dysuria  Bacterial vaginitis     Discharge Instructions     Please take medications as prescribed Do not drink alcohol whilst taking metronidazole We will call you with recommendations if labs are abnormal Return to urgent care if symptoms worsen. Increase oral fluid intake   ED Prescriptions     Medication Sig Dispense Auth. Provider   metroNIDAZOLE (FLAGYL) 500 MG tablet Take 1 tablet (500 mg total) by mouth 2 (two) times daily. 14 tablet Samayah Novinger, , MD     PDMP not reviewed  this encounter.   Merrilee Jansky, MD 04/07/21 3790    Merrilee Jansky, MD 04/07/21 [phone]

## 2021-04-07 NOTE — Telephone Encounter (Signed)
Left patient an urgent message to call and reschedule Colpo from NO SHOW on 03/21/2021. Patient will also need to schedule a 3 month F/U appointment with Misty Stanley around 05/25/2021, not left on message.

## 2021-04-08 ENCOUNTER — Ambulatory Visit (HOSPITAL_COMMUNITY): Payer: Self-pay

## 2021-04-11 ENCOUNTER — Ambulatory Visit (HOSPITAL_COMMUNITY): Payer: Self-pay

## 2021-04-13 ENCOUNTER — Ambulatory Visit (HOSPITAL_COMMUNITY): Payer: Self-pay

## 2021-04-15 ENCOUNTER — Ambulatory Visit (HOSPITAL_COMMUNITY): Payer: Self-pay

## 2021-04-18 ENCOUNTER — Ambulatory Visit (HOSPITAL_COMMUNITY): Payer: Self-pay

## 2021-04-20 ENCOUNTER — Ambulatory Visit (HOSPITAL_COMMUNITY): Payer: Self-pay

## 2021-04-22 ENCOUNTER — Ambulatory Visit (HOSPITAL_COMMUNITY): Payer: Self-pay

## 2021-04-25 ENCOUNTER — Ambulatory Visit (HOSPITAL_COMMUNITY): Payer: Self-pay

## 2021-04-27 ENCOUNTER — Ambulatory Visit (HOSPITAL_COMMUNITY): Payer: Self-pay

## 2021-04-29 ENCOUNTER — Ambulatory Visit (HOSPITAL_COMMUNITY): Payer: Self-pay

## 2021-11-28 ENCOUNTER — Other Ambulatory Visit: Payer: Self-pay

## 2021-12-01 ENCOUNTER — Ambulatory Visit (HOSPITAL_COMMUNITY)
Admission: RE | Admit: 2021-12-01 | Discharge: 2021-12-01 | Disposition: A | Discharge status: Home / Self Care | Payer: Medicaid Other | Source: Ambulatory Visit | Attending: Internal Medicine | Admitting: Internal Medicine

## 2021-12-01 ENCOUNTER — Encounter (HOSPITAL_COMMUNITY): Payer: Self-pay

## 2021-12-01 ENCOUNTER — Other Ambulatory Visit: Payer: Self-pay

## 2021-12-01 VITALS — BP 113/70 | HR 92 | Temp 97.9°F | Resp 18

## 2021-12-01 DIAGNOSIS — N760 Acute vaginitis: Secondary | ICD-10-CM | POA: Diagnosis present

## 2021-12-01 LAB — POCT URINALYSIS DIPSTICK, ED / UC
Bilirubin Urine: NEGATIVE
Glucose, UA: NEGATIVE mg/dL
Leukocytes,Ua: NEGATIVE
Nitrite: NEGATIVE
Protein, ur: 30 mg/dL — AB
Specific Gravity, Urine: >=1.03 (ref 1.005–1.030)
Urobilinogen, UA: 0.2 mg/dL (ref 0.0–1.0)
pH: 5.5 (ref 5.0–8.0)

## 2021-12-01 MED ORDER — FLUCONAZOLE 150 MG PO TABS: 150.0000 mg | ORAL_TABLET | Freq: Once | ORAL | 0 refills | Status: AC

## 2021-12-01 NOTE — ED Triage Notes (Signed)
Pt is present today with pelvic pain, vaginal odor, vaginal discharge and abnormal bleeding. Pt sx started one week ago.

## 2021-12-01 NOTE — Discharge Instructions (Addendum)
Please take medications as prescribed We will call you with recommendations if labs are abnormal If you continue to have pelvic pain with intermittent vaginal bleeding please follow-up with your gynecologist for further evaluation.

## 2021-12-02 ENCOUNTER — Telehealth (HOSPITAL_COMMUNITY): Payer: Self-pay | Admitting: Emergency Medicine

## 2021-12-02 LAB — CERVICOVAGINAL ANCILLARY ONLY
Bacterial Vaginitis (gardnerella): POSITIVE — AB
Candida Glabrata: NEGATIVE
Candida Vaginitis: NEGATIVE
Chlamydia: NEGATIVE
Comment: NEGATIVE
Comment: NEGATIVE
Comment: NEGATIVE
Comment: NEGATIVE
Comment: NEGATIVE
Comment: NORMAL
Neisseria Gonorrhea: NEGATIVE
Trichomonas: NEGATIVE

## 2021-12-02 MED ORDER — METRONIDAZOLE 500 MG PO TABS: 500.0000 mg | ORAL_TABLET | Freq: Two times a day (BID) | ORAL | 0 refills | Status: DC

## 2021-12-05 NOTE — ED Provider Notes (Signed)
[facility]    CSN: [id number] Arrival date & time: 12/01/21  1740      History   Chief Complaint Chief Complaint  Patient presents with   Pelvic Pain   Vaginal Discharge   Vaginal Bleeding    HPI Emily Poole is a 26 y.o. female to the urgent care with 1 week history of malodorous vaginal discharge and abnormal bleeding of 1 week duration.  Patient's symptoms started a week ago and has been persistent.  She denies any itching with malodorous rash.  Patient had her Nexplanon changed several months ago.  She has never had any problems with Nexplanon.  Over the past week she has been having some spotty vaginal bleeding.  No abdominal pain or cramps.  Patient denies dysuria urgency or frequency.  She has some vaginal irritation.  Patient is sexually active and engages in unprotected sexual intercourse with 1 partner.   HPI  Past Medical History:  Diagnosis Date   Cervical condyloma    Chlamydia    Hypertension    Pregnancy affected by intrauterine growth retardation (IUGR)     Patient Active Problem List   Diagnosis Date Noted   Nexplanon in place 02/22/2021   Family history of breast cancer in first degree relative 02/22/2021   Alcohol abuse 02/22/2021   Cervical condyloma 09/22/2014    Past Surgical History:  Procedure Laterality Date   CESAREAN SECTION N/A 12/25/2014   Procedure: CESAREAN SECTION;  Surgeon: Lesly Dukes, MD;  Location: [facility];  Service: Obstetrics;  Laterality: N/A;   WISDOM TOOTH EXTRACTION      OB History     Gravida  1   Para  1   Term      Preterm  1   AB      Living  1      SAB      IAB      Ectopic      Multiple  0   Live Births  1            Home Medications    Prior to Admission medications   Medication Sig Start Date End Date Taking? Authorizing Provider  Etonogestrel (NEXPLANON [place]) Inject into the skin.    [provider]  metroNIDAZOLE (FLAGYL) 500 MG tablet Take 1 tablet (500 mg  total) by mouth 2 (two) times daily. 12/02/21   Lachlyn Vanderstelt, Britta Mccreedy, MD    Family History Family History  Problem Relation Age of Onset   Diabetes Father    Hypertension Mother     Social History Social History   Tobacco Use   Smoking status: Never   Smokeless tobacco: Never  Vaping Use   Vaping Use: Never used  Substance Use Topics   Alcohol use: Yes    Comment: occ   Drug use: No     Allergies   Patient has no known allergies.   Review of Systems Review of Systems As per HPI  Physical Exam Triage Vital Signs ED Triage Vitals  Enc Vitals Group     BP 12/01/21 1815 113/70     Pulse Rate 12/01/21 1815 92     Resp 12/01/21 1815 18     Temp 12/01/21 1815 97.9 F (36.6 C)     Temp Source 12/01/21 1815 Oral     SpO2 12/01/21 1815 96 %     Weight --      Height --      Head Circumference --  Peak Flow --      Pain Score 12/01/21 1814 8     Pain Loc --      Pain Edu? --      Excl. in GC? --    No data found.  Updated Vital Signs BP 113/70    Pulse 92    Temp 97.9 F (36.6 C) (Oral)    Resp 18    SpO2 96%   Visual Acuity Right Eye Distance:   Left Eye Distance:   Bilateral Distance:    Right Eye Near:   Left Eye Near:    Bilateral Near:     Physical Exam Vitals and nursing note reviewed.  Constitutional:      General: She is not in acute distress.    Appearance: She is not ill-appearing.  Cardiovascular:     Rate and Rhythm: Normal rate and regular rhythm.     Pulses: Normal pulses.     Heart sounds: Normal heart sounds.  Pulmonary:     Effort: Pulmonary effort is normal.     Breath sounds: Normal breath sounds.  Abdominal:     General: Bowel sounds are normal.     Palpations: Abdomen is soft.  Neurological:     Mental Status: She is alert.     UC Treatments / Results  Labs (all labs ordered are listed, but only abnormal results are displayed) Labs Reviewed  POCT URINALYSIS DIPSTICK, ED / UC - Abnormal; Notable for the following  components:      Result Value   Ketones, ur TRACE (*)    Hgb urine dipstick LARGE (*)    Protein, ur 30 (*)    All other components within normal limits  CERVICOVAGINAL ANCILLARY ONLY - Abnormal; Notable for the following components:   Bacterial Vaginitis (gardnerella) Positive (*)    All other components within normal limits    EKG   Radiology No results found.  Procedures Procedures (including critical care time)  Medications Ordered in UC Medications - No data to display  Initial Impression / Assessment and Plan / UC Course  I have reviewed the triage vital signs and the nursing notes.  Pertinent labs & imaging results that were available during my care of the patient were reviewed by me and considered in my medical decision making (see chart for details).     1.  Acute vaginitis: Diflucan Point-of-care urinalysis is significant for ketones and hemoglobin.  Leukocyte Estrace is negative Cervicovaginal swab for GC/chlamydia/trichomonas/BV/yeast. Return precautions given Plan of care will be updated once labs are available. Final Clinical Impressions(s) / UC Diagnoses   Final diagnoses:  Acute vaginitis     Discharge Instructions      Please take medications as prescribed We will call you with recommendations if labs are abnormal If you continue to have pelvic pain with intermittent vaginal bleeding please follow-up with your gynecologist for further evaluation.   ED Prescriptions     Medication Sig Dispense Auth. Provider   fluconazole (DIFLUCAN) 150 MG tablet Take 1 tablet (150 mg total) by mouth once for 1 dose. Take second tablet in 72 hours if no improvement in symptoms 7 tablet Dalon Reichart, Britta Mccreedy, MD      PDMP not reviewed this encounter.   Merrilee Jansky, MD 12/05/21 [phone]

## 2022-01-07 ENCOUNTER — Ambulatory Visit (HOSPITAL_COMMUNITY): Payer: Medicaid Other

## 2022-01-14 ENCOUNTER — Ambulatory Visit (HOSPITAL_COMMUNITY)
Admission: RE | Admit: 2022-01-14 | Discharge: 2022-01-14 | Disposition: A | Discharge status: Home / Self Care | Payer: Medicaid Other | Source: Ambulatory Visit | Attending: Student | Admitting: Student

## 2022-01-14 ENCOUNTER — Other Ambulatory Visit: Payer: Self-pay

## 2022-01-14 ENCOUNTER — Encounter (HOSPITAL_COMMUNITY): Payer: Self-pay

## 2022-01-14 VITALS — BP 113/74 | HR 76 | Temp 99.0°F | Resp 17

## 2022-01-14 DIAGNOSIS — N3001 Acute cystitis with hematuria: Secondary | ICD-10-CM | POA: Diagnosis present

## 2022-01-14 DIAGNOSIS — R0789 Other chest pain: Secondary | ICD-10-CM | POA: Insufficient documentation

## 2022-01-14 DIAGNOSIS — J069 Acute upper respiratory infection, unspecified: Secondary | ICD-10-CM | POA: Diagnosis not present

## 2022-01-14 LAB — POCT URINALYSIS DIPSTICK, ED / UC
Bilirubin Urine: NEGATIVE
Glucose, UA: NEGATIVE mg/dL
Ketones, ur: NEGATIVE mg/dL
Nitrite: NEGATIVE
Protein, ur: NEGATIVE mg/dL
Specific Gravity, Urine: 1.025 (ref 1.005–1.030)
Urobilinogen, UA: 0.2 mg/dL (ref 0.0–1.0)
pH: 6.5 (ref 5.0–8.0)

## 2022-01-14 MED ORDER — SULFAMETHOXAZOLE-TRIMETHOPRIM 800-160 MG PO TABS: 1.0000 | ORAL_TABLET | Freq: Two times a day (BID) | ORAL | 0 refills | Status: AC

## 2022-01-14 MED ORDER — ALBUTEROL SULFATE HFA 108 (90 BASE) MCG/ACT IN AERS: 1.0000 | INHALATION_SPRAY | Freq: Four times a day (QID) | RESPIRATORY_TRACT | 0 refills | Status: AC | PRN

## 2022-01-14 MED ORDER — PROMETHAZINE-DM 6.25-15 MG/5ML PO SYRP: 5.0000 mL | ORAL_SOLUTION | Freq: Four times a day (QID) | ORAL | 0 refills | Status: DC | PRN

## 2022-01-14 NOTE — Discharge Instructions (Addendum)
-  Albuterol inhaler as needed for cough, wheezing, shortness of breath, 1 to 2 puffs every 6 hours as needed. -Promethazine DM cough syrup for congestion/cough. This could make you drowsy, so take at night before bed. -Start the antibiotic-Bactrim (trimethoprim-sulfamethoxazole) twice daily x7 days. Take with food if you have a sensitive stomach. -Mucinex during the day if needed -Drink plenty of water -With a virus, you're typically contagious for 5-7 days, or as long as you're having fevers.  -Follow-up if abdominal pain getting worse or new symptoms like fevers/chlls, worsening back pain

## 2022-01-14 NOTE — ED Triage Notes (Signed)
Pt presents with scratchy throat and dry cough x 3 days.   States she has a hx of bronchitis and c/o urinary frequency.

## 2022-01-14 NOTE — ED Provider Notes (Signed)
[facility]    CSN: [id number] Arrival date & time: 01/14/22  1048      History   Chief Complaint Chief Complaint  Patient presents with   Urinary Frequency   Sore Throat   Cough    HPI Emily Poole is a 26 y.o. female presenting with sore throat, cough x3 days; and urinary symptoms x7 days. History childhood asthma per pt, no current inhalers. Also with history UTI, last few years ago.  -Describes suprapubic pressure, dysuria, frequency, L flank pain. Has attempted cranberry  juice without relief. Denies fevers/chills, vaginal discharge. Nexplanon contraception.  -Dry cough x3 days that is keeping her up at night. Chest wall pain with coughing, particularly at night. Has attempted hot tea only.  HPI  Past Medical History:  Diagnosis Date   Cervical condyloma    Chlamydia    Hypertension    Pregnancy affected by intrauterine growth retardation (IUGR)     Patient Active Problem List   Diagnosis Date Noted   Nexplanon in place 02/22/2021   Family history of breast cancer in first degree relative 02/22/2021   Alcohol abuse 02/22/2021   Cervical condyloma 09/22/2014    Past Surgical History:  Procedure Laterality Date   CESAREAN SECTION N/A 12/25/2014   Procedure: CESAREAN SECTION;  Surgeon: Guss Bunde, MD;  Location: [facility];  Service: Obstetrics;  Laterality: N/A;   WISDOM TOOTH EXTRACTION      OB History     Gravida  1   Para  1   Term      Preterm  1   AB      Living  1      SAB      IAB      Ectopic      Multiple  0   Live Births  1            Home Medications    Prior to Admission medications   Medication Sig Start Date End Date Taking? Authorizing Provider  albuterol (VENTOLIN HFA) 108 (90 Base) MCG/ACT inhaler Inhale 1-2 puffs into the lungs every 6 (six) hours as needed for wheezing or shortness of breath. 01/14/22  Yes Hazel Sams, PA-C  promethazine-dextromethorphan (PROMETHAZINE-DM) 6.25-15 MG/5ML syrup  Take 5 mLs by mouth 4 (four) times daily as needed for cough. 01/14/22  Yes Hazel Sams, PA-C  sulfamethoxazole-trimethoprim (BACTRIM DS) 800-160 MG tablet Take 1 tablet by mouth 2 (two) times daily for 7 days. 01/14/22 01/21/22 Yes Hazel Sams, PA-C  Etonogestrel (NEXPLANON [place]) Inject into the skin.    [provider]  metroNIDAZOLE (FLAGYL) 500 MG tablet Take 1 tablet (500 mg total) by mouth 2 (two) times daily. 12/02/21   Lamptey, Myrene Galas, MD    Family History Family History  Problem Relation Age of Onset   Diabetes Father    Hypertension Mother     Social History Social History   Tobacco Use   Smoking status: Never   Smokeless tobacco: Never  Vaping Use   Vaping Use: Never used  Substance Use Topics   Alcohol use: Yes    Comment: occ   Drug use: No     Allergies   Patient has no known allergies.   Review of Systems Review of Systems  Constitutional:  Negative for appetite change, chills and fever.  HENT:  Positive for congestion. Negative for ear pain, rhinorrhea, sinus pressure, sinus pain and sore throat.   Eyes:  Negative for redness  and visual disturbance.  Respiratory:  Positive for cough. Negative for chest tightness, shortness of breath and wheezing.   Cardiovascular:  Negative for chest pain and palpitations.  Gastrointestinal:  Positive for abdominal pain. Negative for constipation, diarrhea, nausea and vomiting.  Genitourinary:  Positive for dysuria, flank pain and frequency. Negative for decreased urine volume, hematuria, menstrual problem, pelvic pain, urgency, vaginal bleeding, vaginal discharge and vaginal pain.  Musculoskeletal:  Negative for myalgias.  Neurological:  Negative for dizziness, weakness and headaches.  Psychiatric/Behavioral:  Negative for confusion.   All other systems reviewed and are negative.   Physical Exam Triage Vital Signs ED Triage Vitals  Enc Vitals Group     BP 01/14/22 1111 113/74     Pulse Rate 01/14/22  1111 76     Resp 01/14/22 1111 17     Temp 01/14/22 1111 99 F (37.2 C)     Temp Source 01/14/22 1111 Oral     SpO2 01/14/22 1111 99 %     Weight --      Height --      Head Circumference --      Peak Flow --      Pain Score 01/14/22 1109 0     Pain Loc --      Pain Edu? --      Excl. in GC? --    No data found.  Updated Vital Signs BP 113/74 (BP Location: Left Arm)    Pulse 76    Temp 99 F (37.2 C) (Oral)    Resp 17    LMP  (LMP Unknown)    SpO2 99%   Visual Acuity Right Eye Distance:   Left Eye Distance:   Bilateral Distance:    Right Eye Near:   Left Eye Near:    Bilateral Near:     Physical Exam Vitals reviewed.  Constitutional:      General: She is not in acute distress.    Appearance: Normal appearance. She is not ill-appearing.  HENT:     Head: Normocephalic and atraumatic.     Right Ear: Tympanic membrane, ear canal and external ear normal. No tenderness. No middle ear effusion. There is no impacted cerumen. Tympanic membrane is not perforated, erythematous, retracted or bulging.     Left Ear: Tympanic membrane, ear canal and external ear normal. No tenderness.  No middle ear effusion. There is no impacted cerumen. Tympanic membrane is not perforated, erythematous, retracted or bulging.     Nose: Nose normal. No congestion.     Mouth/Throat:     Mouth: Mucous membranes are moist.     Pharynx: Uvula midline. No oropharyngeal exudate or posterior oropharyngeal erythema.     Comments: Moist mucous membranes Eyes:     Extraocular Movements: Extraocular movements intact.     Pupils: Pupils are equal, round, and reactive to light.  Cardiovascular:     Rate and Rhythm: Normal rate and regular rhythm.     Heart sounds: Normal heart sounds.  Pulmonary:     Effort: Pulmonary effort is normal.     Breath sounds: Wheezing present. No decreased breath sounds, rhonchi or rales.     Comments: Few expiratory wheezes throughout  Abdominal:     General: Bowel sounds are  normal. There is no distension.     Palpations: Abdomen is soft. There is no mass.     Tenderness: There is abdominal tenderness in the suprapubic area. There is left CVA tenderness. There is no right CVA tenderness,  guarding or rebound. Negative signs include Murphy's sign, Rovsing's sign and McBurney's sign.  Lymphadenopathy:     Cervical: No cervical adenopathy.     Right cervical: No superficial cervical adenopathy.    Left cervical: No superficial cervical adenopathy.  Skin:    General: Skin is warm.     Capillary Refill: Capillary refill takes less than 2 seconds.     Comments: Good skin turgor  Neurological:     General: No focal deficit present.     Mental Status: She is alert and oriented to person, place, and time.  Psychiatric:        Mood and Affect: Mood normal.        Behavior: Behavior normal.        Thought Content: Thought content normal.        Judgment: Judgment normal.     UC Treatments / Results  Labs (all labs ordered are listed, but only abnormal results are displayed) Labs Reviewed  POCT URINALYSIS DIPSTICK, ED / UC - Abnormal; Notable for the following components:      Result Value   Hgb urine dipstick MODERATE (*)    Leukocytes,Ua LARGE (*)    All other components within normal limits  URINE CULTURE    EKG   Radiology No results found.  Procedures Procedures (including critical care time)  Medications Ordered in UC Medications - No data to display  Initial Impression / Assessment and Plan / UC Course  I have reviewed the triage vital signs and the nursing notes.  Pertinent labs & imaging results that were available during my care of the patient were reviewed by me and considered in my medical decision making (see chart for details).     This patient is a very pleasant 26 y.o. year old female presenting with acute cystitis and bronchitis. Afebrile, nontachy. There is some L CVAT.  UA with blood and leuk, culture sent . Nexplanon  contraception, did not check a pregnancy test.   I have concern for early pyelo given exam. Will manage with bactrim as below.,   For cough/ history of asthma, sent albuterol inhaler and promethazine DM.   ED return precautions discussed. Patient verbalizes understanding and agreement.   Coding Level 4 for acute illness with systemic symptoms, and prescription drug management    Final Clinical Impressions(s) / UC Diagnoses   Final diagnoses:  Acute cystitis with hematuria  Chest wall pain  Viral URI with cough     Discharge Instructions      -Albuterol inhaler as needed for cough, wheezing, shortness of breath, 1 to 2 puffs every 6 hours as needed. -Promethazine DM cough syrup for congestion/cough. This could make you drowsy, so take at night before bed. -Start the antibiotic-Bactrim (trimethoprim-sulfamethoxazole) twice daily x7 days. Take with food if you have a sensitive stomach. -Mucinex during the day if needed -Drink plenty of water -With a virus, you're typically contagious for 5-7 days, or as long as you're having fevers.  -Follow-up if abdominal pain getting worse or new symptoms like fevers/chlls, worsening back pain    ED Prescriptions     Medication Sig Dispense Auth. Provider   promethazine-dextromethorphan (PROMETHAZINE-DM) 6.25-15 MG/5ML syrup Take 5 mLs by mouth 4 (four) times daily as needed for cough. 118 mL Hazel Sams, PA-C   sulfamethoxazole-trimethoprim (BACTRIM DS) 800-160 MG tablet Take 1 tablet by mouth 2 (two) times daily for 7 days. 14 tablet Hazel Sams, PA-C   albuterol (VENTOLIN HFA) 108 (90  Base) MCG/ACT inhaler Inhale 1-2 puffs into the lungs every 6 (six) hours as needed for wheezing or shortness of breath. 1 each Hazel Sams, PA-C      PDMP not reviewed this encounter.   Hazel Sams, PA-C 01/14/22 1228

## 2022-01-16 LAB — URINE CULTURE: Culture: >=100000 — AB

## 2022-01-20 ENCOUNTER — Ambulatory Visit (HOSPITAL_COMMUNITY): Payer: Medicaid Other

## 2022-02-05 ENCOUNTER — Ambulatory Visit: Payer: Medicaid Other

## 2022-03-07 ENCOUNTER — Ambulatory Visit: Payer: Medicaid Other

## 2022-04-18 ENCOUNTER — Ambulatory Visit (HOSPITAL_COMMUNITY): Payer: Medicaid Other

## 2022-04-28 ENCOUNTER — Ambulatory Visit
Admission: RE | Admit: 2022-04-28 | Discharge: 2022-04-28 | Disposition: A | Discharge status: Home / Self Care | Payer: Medicaid Other | Source: Ambulatory Visit | Attending: Physician Assistant | Admitting: Physician Assistant

## 2022-04-28 VITALS — BP 96/61 | HR 76 | Temp 98.1°F | Resp 12

## 2022-04-28 DIAGNOSIS — N898 Other specified noninflammatory disorders of vagina: Secondary | ICD-10-CM | POA: Diagnosis not present

## 2022-04-28 MED ORDER — FLUCONAZOLE 150 MG PO TABS: 150.0000 mg | ORAL_TABLET | Freq: Every day | ORAL | 1 refills | Status: DC

## 2022-04-28 NOTE — ED Triage Notes (Addendum)
Patient c/o vaginal discharge x 1 week.   Patient denies dysuira or ABD pain.   Patient endorses foul smelling discharge and vaginal itching.   Patient is unaware if exposed to STI.   Patient is on nexplanon contraception.   Patient has used Boric Acid suppository with no relief of symptoms.

## 2022-04-29 NOTE — ED Provider Notes (Signed)
[name]    CSN: [id number] Arrival date & time: 04/28/22  0847      History   Chief Complaint Chief Complaint  Patient presents with   Vaginal Discharge    HPI Emily Poole is a 26 y.o. female.   Patient complains of a vaginal discharge she used a boric acid suppository without relief.  Patient request STD testing  The history is provided by the patient. No language interpreter was used.  Vaginal Discharge Quality:  Watery Severity:  Moderate  Past Medical History:  Diagnosis Date   Cervical condyloma    Chlamydia    Hypertension    Pregnancy affected by intrauterine growth retardation (IUGR)     Patient Active Problem List   Diagnosis Date Noted   Nexplanon in place 02/22/2021   Family history of breast cancer in first degree relative 02/22/2021   Alcohol abuse 02/22/2021   Cervical condyloma 09/22/2014    Past Surgical History:  Procedure Laterality Date   CESAREAN SECTION N/A 12/25/2014   Procedure: CESAREAN SECTION;  Surgeon: Lesly Dukes, MD;  Location: [facility];  Service: Obstetrics;  Laterality: N/A;   WISDOM TOOTH EXTRACTION      OB History     Gravida  1   Para  1   Term      Preterm  1   AB      Living  1      SAB      IAB      Ectopic      Multiple  0   Live Births  1            Home Medications    Prior to Admission medications   Medication Sig Start Date End Date Taking? Authorizing Provider  fluconazole (DIFLUCAN) 150 MG tablet Take 1 tablet (150 mg total) by mouth daily. 04/28/22  Yes Cheron Schaumann K, PA-C  albuterol (VENTOLIN HFA) 108 (90 Base) MCG/ACT inhaler Inhale 1-2 puffs into the lungs every 6 (six) hours as needed for wheezing or shortness of breath. 01/14/22   Rhys Martini, PA-C  Etonogestrel (NEXPLANON [place]) Inject into the skin.    [provider]  metroNIDAZOLE (FLAGYL) 500 MG tablet Take 1 tablet (500 mg total) by mouth 2 (two) times daily. 12/02/21   Merrilee Jansky, MD   promethazine-dextromethorphan (PROMETHAZINE-DM) 6.25-15 MG/5ML syrup Take 5 mLs by mouth 4 (four) times daily as needed for cough. 01/14/22   Rhys Martini, PA-C    Family History Family History  Problem Relation Age of Onset   Diabetes Father    Hypertension Mother     Social History Social History   Tobacco Use   Smoking status: Never   Smokeless tobacco: Never  Vaping Use   Vaping Use: Never used  Substance Use Topics   Alcohol use: Yes    Comment: occ   Drug use: No     Allergies   Patient has no known allergies.   Review of Systems Review of Systems  Genitourinary:  Positive for vaginal discharge.  All other systems reviewed and are negative.   Physical Exam Triage Vital Signs ED Triage Vitals  Enc Vitals Group     BP 04/28/22 0917 96/61     Pulse Rate 04/28/22 0917 76     Resp 04/28/22 0917 12     Temp 04/28/22 0917 98.1 F (36.7 C)     Temp Source 04/28/22 0917 Oral     SpO2 04/28/22  0917 98 %     Weight --      Height --      Head Circumference --      Peak Flow --      Pain Score 04/28/22 0919 0     Pain Loc --      Pain Edu? --      Excl. in GC? --    No data found.  Updated Vital Signs BP 96/61 (BP Location: Left Arm)   Pulse 76   Temp 98.1 F (36.7 C) (Oral)   Resp 12   LMP 04/14/2022 (Approximate)   SpO2 98%   Visual Acuity Right Eye Distance:   Left Eye Distance:   Bilateral Distance:    Right Eye Near:   Left Eye Near:    Bilateral Near:     Physical Exam Constitutional:      Appearance: She is well-developed.  HENT:     Head: Normocephalic and atraumatic.  Eyes:     Pupils: Pupils are equal, round, and reactive to light.  Cardiovascular:     Rate and Rhythm: Normal rate.  Abdominal:     Palpations: Abdomen is soft.     Tenderness: There is no abdominal tenderness.  Genitourinary:    Comments: Vaginal discharge,  Thick white,  Adnexa no masses,  Cervix nontender Musculoskeletal:        General: Normal range of  motion.  Skin:    General: Skin is warm.     UC Treatments / Results  Labs (all labs ordered are listed, but only abnormal results are displayed) Labs Reviewed  CERVICOVAGINAL ANCILLARY ONLY    EKG   Radiology No results found.  Procedures Procedures (including critical care time)  Medications Ordered in UC Medications - No data to display  Initial Impression / Assessment and Plan / UC Course  I have reviewed the triage vital signs and the nursing notes.  Pertinent labs & imaging results that were available during my care of the patient were reviewed by me and considered in my medical decision making (see chart for details).     MDM patient given fluconazole Rx, STD testing pending Final Clinical Impressions(s) / UC Diagnoses   Final diagnoses:  Vaginal discharge   Discharge Instructions   None    ED Prescriptions     Medication Sig Dispense Auth. Provider   fluconazole (DIFLUCAN) 150 MG tablet Take 1 tablet (150 mg total) by mouth daily. 1 tablet Elson Areas, [place]      PDMP not reviewed this encounter. An After Visit Summary was printed and given to the patient.    Elson Areas, [place] 04/29/22 1036

## 2022-05-02 LAB — CERVICOVAGINAL ANCILLARY ONLY
Bacterial Vaginitis (gardnerella): POSITIVE — AB
Candida Glabrata: NEGATIVE
Candida Vaginitis: NEGATIVE
Chlamydia: NEGATIVE
Comment: NEGATIVE
Comment: NEGATIVE
Comment: NEGATIVE
Comment: NEGATIVE
Comment: NEGATIVE
Comment: NORMAL
Neisseria Gonorrhea: NEGATIVE
Trichomonas: NEGATIVE

## 2022-05-03 ENCOUNTER — Telehealth (HOSPITAL_COMMUNITY): Payer: Self-pay | Admitting: Emergency Medicine

## 2022-05-03 MED ORDER — METRONIDAZOLE 500 MG PO TABS: 500.0000 mg | ORAL_TABLET | Freq: Two times a day (BID) | ORAL | 0 refills | Status: DC

## 2022-08-21 ENCOUNTER — Encounter (HOSPITAL_COMMUNITY): Payer: Self-pay

## 2022-08-21 ENCOUNTER — Ambulatory Visit (HOSPITAL_COMMUNITY)
Admission: RE | Admit: 2022-08-21 | Discharge: 2022-08-21 | Disposition: A | Discharge status: Home / Self Care | Payer: Medicaid Other | Source: Ambulatory Visit | Attending: Family Medicine | Admitting: Family Medicine

## 2022-08-21 VITALS — BP 124/75 | HR 70 | Temp 98.3°F | Resp 12 | Ht 65.0 in | Wt 126.0 lb

## 2022-08-21 DIAGNOSIS — N760 Acute vaginitis: Secondary | ICD-10-CM | POA: Diagnosis not present

## 2022-08-21 LAB — HIV ANTIBODY (ROUTINE TESTING W REFLEX): HIV Screen 4th Generation wRfx: NONREACTIVE

## 2022-08-21 MED ORDER — METRONIDAZOLE 500 MG PO TABS: 500.0000 mg | ORAL_TABLET | Freq: Two times a day (BID) | ORAL | 0 refills | Status: DC

## 2022-08-21 NOTE — ED Provider Notes (Signed)
[facility]    CSN: [id number] Arrival date & time: 08/21/22  0840      History   Chief Complaint Chief Complaint  Patient presents with   Exposure to STD    Entered by patient   Vaginal Itching    HPI Emily Poole is a 26 y.o. female.    Exposure to STD  Vaginal Itching   Here for vaginal odor and discharge and some itching in the last 3 or 4 days.  No fever and no abdominal pain.  No vomiting or nausea.  Last menstrual cycle was about a month ago; she has the Nexplanon in place.   She does have a history of BV  She has not had HIV and syphilis testing in a while.  Past Medical History:  Diagnosis Date   Cervical condyloma    Chlamydia    Hypertension    Pregnancy affected by intrauterine growth retardation (IUGR)     Patient Active Problem List   Diagnosis Date Noted   Nexplanon in place 02/22/2021   Family history of breast cancer in first degree relative 02/22/2021   Alcohol abuse 02/22/2021   Cervical condyloma 09/22/2014    Past Surgical History:  Procedure Laterality Date   CESAREAN SECTION N/A 12/25/2014   Procedure: CESAREAN SECTION;  Surgeon: Lesly Dukes, MD;  Location: [facility];  Service: Obstetrics;  Laterality: N/A;   WISDOM TOOTH EXTRACTION      OB History     Gravida  1   Para  1   Term      Preterm  1   AB      Living  1      SAB      IAB      Ectopic      Multiple  0   Live Births  1            Home Medications    Prior to Admission medications   Medication Sig Start Date End Date Taking? Authorizing Provider  albuterol (VENTOLIN HFA) 108 (90 Base) MCG/ACT inhaler Inhale 1-2 puffs into the lungs every 6 (six) hours as needed for wheezing or shortness of breath. 01/14/22   Rhys Martini, PA-C  Etonogestrel (NEXPLANON [place]) Inject into the skin.    [provider]  fluconazole (DIFLUCAN) 150 MG tablet Take 1 tablet (150 mg total) by mouth daily. 04/28/22   Elson Areas, PA-C   metroNIDAZOLE (FLAGYL) 500 MG tablet Take 1 tablet (500 mg total) by mouth 2 (two) times daily. 08/21/22   Zenia Resides, MD    Family History Family History  Problem Relation Age of Onset   Diabetes Father    Hypertension Mother     Social History Social History   Tobacco Use   Smoking status: Never   Smokeless tobacco: Never  Vaping Use   Vaping Use: Never used  Substance Use Topics   Alcohol use: Yes    Comment: occ   Drug use: No     Allergies   Patient has no known allergies.   Review of Systems Review of Systems   Physical Exam Triage Vital Signs ED Triage Vitals  Enc Vitals Group     BP 08/21/22 0859 124/75     Pulse Rate 08/21/22 0859 70     Resp 08/21/22 0859 12     Temp 08/21/22 0859 98.3 F (36.8 C)     Temp Source 08/21/22 0859 Oral  SpO2 08/21/22 0859 98 %     Weight 08/21/22 0858 126 lb (57.2 kg)     Height 08/21/22 0858 5\' 5"  (1.651 m)     Head Circumference --      Peak Flow --      Pain Score 08/21/22 0858 0     Pain Loc --      Pain Edu? --      Excl. in [place]? --    No data found.  Updated Vital Signs BP 124/75 (BP Location: Left Arm)   Pulse 70   Temp 98.3 F (36.8 C) (Oral)   Resp 12   Ht 5\' 5"  (1.651 m)   Wt 57.2 kg   LMP  (LMP Unknown)   SpO2 98%   BMI 20.97 kg/m   Visual Acuity Right Eye Distance:   Left Eye Distance:   Bilateral Distance:    Right Eye Near:   Left Eye Near:    Bilateral Near:     Physical Exam Vitals reviewed.  Constitutional:      General: She is not in acute distress.    Appearance: She is not ill-appearing, toxic-appearing or diaphoretic.  Cardiovascular:     Rate and Rhythm: Normal rate and regular rhythm.  Pulmonary:     Effort: Pulmonary effort is normal.     Breath sounds: Normal breath sounds.  Abdominal:     Palpations: Abdomen is soft.     Tenderness: There is no abdominal tenderness.  Skin:    Coloration: Skin is not jaundiced or pale.  Neurological:     Mental  Status: She is alert and oriented to person, place, and time.  Psychiatric:        Behavior: Behavior normal.      UC Treatments / Results  Labs (all labs ordered are listed, but only abnormal results are displayed) Labs Reviewed  HIV ANTIBODY (ROUTINE TESTING W REFLEX)  RPR  CERVICOVAGINAL ANCILLARY ONLY    EKG   Radiology No results found.  Procedures Procedures (including critical care time)  Medications Ordered in UC Medications - No data to display  Initial Impression / Assessment and Plan / UC Course  I have reviewed the triage vital signs and the nursing notes.  Pertinent labs & imaging results that were available during my care of the patient were reviewed by me and considered in my medical decision making (see chart for details).         Staff will notify her of any positives on the swab.  She wanted to go ahead and be treated empirically for possible BV.  Education is provided on safe sex practices Final Clinical Impressions(s) / UC Diagnoses   Final diagnoses:  Acute vaginitis     Discharge Instructions      Staff will notify you if there is anything positive on your swab or your blood test.  Take metronidazole 500 mg--1 tablet 2 times daily for 7 days.  Avoid drinking alcohol within 72 hours of taking this medication      ED Prescriptions     Medication Sig Dispense Auth. Provider   metroNIDAZOLE (FLAGYL) 500 MG tablet Take 1 tablet (500 mg total) by mouth 2 (two) times daily. 14 tablet Tonja Jezewski, Gwenlyn Perking, MD      PDMP not reviewed this encounter.   Barrett Henle, MD 08/21/22 [phone]

## 2022-08-21 NOTE — Discharge Instructions (Addendum)
Staff will notify you if there is anything positive on your swab or your blood test.  Take metronidazole 500 mg--1 tablet 2 times daily for 7 days.  Avoid drinking alcohol within 72 hours of taking this medication

## 2022-08-21 NOTE — ED Triage Notes (Signed)
Pt complains of vaginal itching and odor x4days

## 2022-08-22 ENCOUNTER — Telehealth (HOSPITAL_COMMUNITY): Payer: Self-pay | Admitting: Emergency Medicine

## 2022-08-22 LAB — CERVICOVAGINAL ANCILLARY ONLY
Bacterial Vaginitis (gardnerella): POSITIVE — AB
Candida Glabrata: NEGATIVE
Candida Vaginitis: POSITIVE — AB
Chlamydia: NEGATIVE
Comment: NEGATIVE
Comment: NEGATIVE
Comment: NEGATIVE
Comment: NEGATIVE
Comment: NEGATIVE
Comment: NORMAL
Neisseria Gonorrhea: NEGATIVE
Trichomonas: NEGATIVE

## 2022-08-22 LAB — RPR: RPR Ser Ql: NONREACTIVE

## 2022-08-22 MED ORDER — FLUCONAZOLE 150 MG PO TABS: 150.0000 mg | ORAL_TABLET | Freq: Once | ORAL | 0 refills | Status: AC

## 2022-09-25 ENCOUNTER — Ambulatory Visit
Admission: EM | Admit: 2022-09-25 | Discharge: 2022-09-25 | Disposition: A | Discharge status: Home / Self Care | Payer: Medicaid Other | Attending: Internal Medicine | Admitting: Internal Medicine

## 2022-09-25 ENCOUNTER — Other Ambulatory Visit: Payer: Self-pay

## 2022-09-25 DIAGNOSIS — N3001 Acute cystitis with hematuria: Secondary | ICD-10-CM | POA: Diagnosis not present

## 2022-09-25 LAB — POCT URINALYSIS DIP (MANUAL ENTRY)
Bilirubin, UA: NEGATIVE
Glucose, UA: NEGATIVE mg/dL
Ketones, POC UA: NEGATIVE mg/dL
Nitrite, UA: POSITIVE — AB
Spec Grav, UA: 1.025 (ref 1.010–1.025)
Urobilinogen, UA: 0.2 E.U./dL
pH, UA: 6.5 (ref 5.0–8.0)

## 2022-09-25 MED ORDER — CEPHALEXIN 500 MG PO CAPS: 500.0000 mg | ORAL_CAPSULE | Freq: Two times a day (BID) | ORAL | 0 refills | Status: AC

## 2022-09-25 NOTE — Discharge Instructions (Addendum)
Increase oral fluid intake Take medications as prescribed Tylenol/Motrin as needed for abdominal pain We will call you with recommendations if labs are abnormal Return to urgent care if you have any other symptoms.

## 2022-09-25 NOTE — ED Triage Notes (Signed)
Pt c/o dysuria and frequency onset today with vaginal odor. Requesting sti and ua testing.

## 2022-09-25 NOTE — ED Provider Notes (Signed)
[name]    CSN: [id number] Arrival date & time: 09/25/22  1754      History   Chief Complaint Chief Complaint  Patient presents with   Dysuria    HPI Emily Poole is a 26 y.o. female was to the urgent care with 1 day history of dysuria, urinary urgency and frequency of urination.  Patient's symptoms started this morning.  She has lower abdominal discomfort.  No nausea, vomiting, flank pain.  No fever or chills.  Patient denies any vaginal discharge.  She is requesting STI testing.  Patient is sexually active and engages in unprotected sexual intercourse.  Last menstrual period was early October 2023.   HPI  Past Medical History:  Diagnosis Date   Cervical condyloma    Chlamydia    Hypertension    Pregnancy affected by intrauterine growth retardation (IUGR)     Patient Active Problem List   Diagnosis Date Noted   Nexplanon in place 02/22/2021   Family history of breast cancer in first degree relative 02/22/2021   Alcohol abuse 02/22/2021   Cervical condyloma 09/22/2014    Past Surgical History:  Procedure Laterality Date   CESAREAN SECTION N/A 12/25/2014   Procedure: CESAREAN SECTION;  Surgeon: Lesly Dukes, MD;  Location: [facility];  Service: Obstetrics;  Laterality: N/A;   WISDOM TOOTH EXTRACTION      OB History     Gravida  1   Para  1   Term      Preterm  1   AB      Living  1      SAB      IAB      Ectopic      Multiple  0   Live Births  1            Home Medications    Prior to Admission medications   Medication Sig Start Date End Date Taking? Authorizing Provider  cephALEXin (KEFLEX) 500 MG capsule Take 1 capsule (500 mg total) by mouth 2 (two) times daily for 5 days. 09/25/22 09/30/22 Yes Syriah Delisi, Britta Mccreedy, MD  albuterol (VENTOLIN HFA) 108 (90 Base) MCG/ACT inhaler Inhale 1-2 puffs into the lungs every 6 (six) hours as needed for wheezing or shortness of breath. 01/14/22   Rhys Martini, PA-C  Etonogestrel  (NEXPLANON [place]) Inject into the skin.    [provider]    Family History Family History  Problem Relation Age of Onset   Diabetes Father    Hypertension Mother     Social History Social History   Tobacco Use   Smoking status: Never   Smokeless tobacco: Never  Vaping Use   Vaping Use: Never used  Substance Use Topics   Alcohol use: Yes    Comment: occ   Drug use: No     Allergies   Patient has no known allergies.   Review of Systems Review of Systems As per HPI  Physical Exam Triage Vital Signs ED Triage Vitals [09/25/22 1844]  Enc Vitals Group     BP 129/77     Pulse Rate 100     Resp 16     Temp 98.4 F (36.9 C)     Temp Source Oral     SpO2 98 %     Weight      Height      Head Circumference      Peak Flow      Pain Score 8  Pain Loc      Pain Edu?      Excl. in GC?    No data found.  Updated Vital Signs BP 129/77 (BP Location: Left Arm)   Pulse 100   Temp 98.4 F (36.9 C) (Oral)   Resp 16   SpO2 98%   Visual Acuity Right Eye Distance:   Left Eye Distance:   Bilateral Distance:    Right Eye Near:   Left Eye Near:    Bilateral Near:     Physical Exam Vitals and nursing note reviewed.  Constitutional:      General: She is not in acute distress.    Appearance: She is not ill-appearing.  HENT:     Right Ear: Tympanic membrane normal.     Left Ear: Tympanic membrane normal.  Cardiovascular:     Rate and Rhythm: Normal rate and regular rhythm.     Pulses: Normal pulses.     Heart sounds: Normal heart sounds.  Pulmonary:     Effort: Pulmonary effort is normal.     Breath sounds: Normal breath sounds.  Abdominal:     General: Bowel sounds are normal. There is no distension.     Palpations: Abdomen is soft.     Tenderness: There is no abdominal tenderness. There is no right CVA tenderness, left CVA tenderness or guarding.     Hernia: No hernia is present.  Neurological:     Mental Status: She is alert.      UC  Treatments / Results  Labs (all labs ordered are listed, but only abnormal results are displayed) Labs Reviewed  POCT URINALYSIS DIP (MANUAL ENTRY) - Abnormal; Notable for the following components:      Result Value   Clarity, UA hazy (*)    Blood, UA trace-intact (*)    Protein Ur, POC trace (*)    Nitrite, UA Positive (*)    Leukocytes, UA Small (1+) (*)    All other components within normal limits  URINE CULTURE  CERVICOVAGINAL ANCILLARY ONLY    EKG   Radiology No results found.  Procedures Procedures (including critical care time)  Medications Ordered in UC Medications - No data to display  Initial Impression / Assessment and Plan / UC Course  I have reviewed the triage vital signs and the nursing notes.  Pertinent labs & imaging results that were available during my care of the patient were reviewed by me and considered in my medical decision making (see chart for details).     1.  Acute cystitis with hematuria: Point-of-care urinalysis is positive for leukocyte Estrace, nitrite and hemoglobin Urine cultures have been sent Keflex 500 mg twice daily for 5 days We will call patient with recommendations if labs are abnormal Increase oral fluid intake Return precautions given  2.  STI testing: Patient is requesting STI testing Cervicovaginal swab for GC/chlamydia/trichomonas/bacterial vaginosis/vaginal yeast. Final Clinical Impressions(s) / UC Diagnoses   Final diagnoses:  Acute cystitis with hematuria     Discharge Instructions      Increase oral fluid intake Take medications as prescribed Tylenol/Motrin as needed for abdominal pain We will call you with recommendations if labs are abnormal Return to urgent care if you have any other symptoms.   ED Prescriptions     Medication Sig Dispense Auth. Provider   cephALEXin (KEFLEX) 500 MG capsule Take 1 capsule (500 mg total) by mouth 2 (two) times daily for 5 days. 10 capsule Saul Dorsi, Britta Mccreedy, MD  PDMP not reviewed this encounter.   Chase Picket, MD 09/25/22 [phone]

## 2022-10-19 ENCOUNTER — Other Ambulatory Visit: Payer: Self-pay

## 2022-10-24 LAB — URINE CULTURE

## 2022-10-26 LAB — SPECIMEN STATUS REPORT

## 2022-10-29 LAB — NUSWAB VAGINITIS PLUS (VG+)
Candida albicans, NAA: POSITIVE — AB
Candida glabrata, NAA: NEGATIVE
Chlamydia trachomatis, NAA: NEGATIVE
Neisseria gonorrhoeae, NAA: NEGATIVE
Trich vag by NAA: NEGATIVE

## 2022-10-29 LAB — SPECIMEN STATUS REPORT

## 2022-10-30 ENCOUNTER — Encounter (HOSPITAL_COMMUNITY): Payer: Self-pay

## 2022-10-30 ENCOUNTER — Ambulatory Visit (HOSPITAL_COMMUNITY)
Admission: RE | Admit: 2022-10-30 | Discharge: 2022-10-30 | Disposition: A | Discharge status: Home / Self Care | Payer: Medicaid Other | Source: Ambulatory Visit | Attending: Emergency Medicine | Admitting: Emergency Medicine

## 2022-10-30 VITALS — BP 130/82 | HR 86 | Temp 98.2°F | Resp 16

## 2022-10-30 DIAGNOSIS — Z202 Contact with and (suspected) exposure to infections with a predominantly sexual mode of transmission: Secondary | ICD-10-CM | POA: Diagnosis not present

## 2022-10-30 DIAGNOSIS — Z113 Encounter for screening for infections with a predominantly sexual mode of transmission: Secondary | ICD-10-CM | POA: Insufficient documentation

## 2022-10-30 NOTE — ED Provider Notes (Signed)
[facility]    CSN: [id number] Arrival date & time: 10/30/22  0844      History   Chief Complaint Chief Complaint  Patient presents with   Exposure to STD    Entered by patient    HPI Emily Poole is a 26 y.o. female.  Presents for STD testing Denies any symptoms, no known exposures She is currently on her menstrual cycle  Past Medical History:  Diagnosis Date   Cervical condyloma    Chlamydia    Hypertension    Pregnancy affected by intrauterine growth retardation (IUGR)     Patient Active Problem List   Diagnosis Date Noted   Nexplanon in place 02/22/2021   Family history of breast cancer in first degree relative 02/22/2021   Alcohol abuse 02/22/2021   Cervical condyloma 09/22/2014    Past Surgical History:  Procedure Laterality Date   CESAREAN SECTION N/A 12/25/2014   Procedure: CESAREAN SECTION;  Surgeon: Lesly Dukes, MD;  Location: [facility];  Service: Obstetrics;  Laterality: N/A;   WISDOM TOOTH EXTRACTION      OB History     Gravida  1   Para  1   Term      Preterm  1   AB      Living  1      SAB      IAB      Ectopic      Multiple  0   Live Births  1            Home Medications    Prior to Admission medications   Medication Sig Start Date End Date Taking? Authorizing Provider  albuterol (VENTOLIN HFA) 108 (90 Base) MCG/ACT inhaler Inhale 1-2 puffs into the lungs every 6 (six) hours as needed for wheezing or shortness of breath. 01/14/22   Rhys Martini, PA-C  Etonogestrel (NEXPLANON ) Inject into the skin.    [provider]    Family History Family History  Problem Relation Age of Onset   Diabetes Father    Hypertension Mother     Social History Social History   Tobacco Use   Smoking status: Never   Smokeless tobacco: Never  Vaping Use   Vaping Use: Never used  Substance Use Topics   Alcohol use: Yes    Comment: occ   Drug use: No     Allergies   Patient has no known  allergies.   Review of Systems Review of Systems  As per HPI  Physical Exam Triage Vital Signs ED Triage Vitals  Enc Vitals Group     BP 10/30/22 0910 130/82     Pulse Rate 10/30/22 0910 86     Resp 10/30/22 0910 16     Temp 10/30/22 0910 98.2 F (36.8 C)     Temp Source 10/30/22 0910 Oral     SpO2 10/30/22 0910 98 %     Weight --      Height --      Head Circumference --      Peak Flow --      Pain Score 10/30/22 0911 0     Pain Loc --      Pain Edu? --      Excl. in [place]? --    No data found.  Updated Vital Signs BP 130/82 (BP Location: Right Arm)   Pulse 86   Temp 98.2 F (36.8 C) (Oral)   Resp 16   LMP 10/29/2022  SpO2 98%    Physical Exam Vitals and nursing note reviewed.  Constitutional:      General: She is not in acute distress.    Appearance: Normal appearance.  HENT:     Mouth/Throat:     Pharynx: Oropharynx is clear.  Cardiovascular:     Rate and Rhythm: Normal rate and regular rhythm.     Pulses: Normal pulses.  Pulmonary:     Effort: Pulmonary effort is normal.  Neurological:     Mental Status: She is alert and oriented to person, place, and time.     UC Treatments / Results  Labs (all labs ordered are listed, but only abnormal results are displayed) Labs Reviewed  CERVICOVAGINAL ANCILLARY ONLY    EKG  Radiology No results found.  Procedures Procedures  Medications Ordered in UC Medications - No data to display  Initial Impression / Assessment and Plan / UC Course  I have reviewed the triage vital signs and the nursing notes.  Pertinent labs & imaging results that were available during my care of the patient were reviewed by me and considered in my medical decision making (see chart for details).  Cytology swab pending Recently had HIV/RPR blood work done, not warranted today Patient with no questions or concerns today  Final Clinical Impressions(s) / UC Diagnoses   Final diagnoses:  Screen for STD (sexually  transmitted disease)     Discharge Instructions      We will call you if anything on your swab returns positive. Please abstain from sexual intercourse until your results return.    ED Prescriptions   None    PDMP not reviewed this encounter.   Marlow Baars, [place] 10/30/22 1950

## 2022-10-30 NOTE — ED Triage Notes (Signed)
Pt is here for STD TESTING  as a preventive

## 2022-10-30 NOTE — Discharge Instructions (Signed)
We will call you if anything on your swab returns positive. Please abstain from sexual intercourse until your results return. 

## 2022-10-31 ENCOUNTER — Telehealth (HOSPITAL_COMMUNITY): Payer: Self-pay | Admitting: Emergency Medicine

## 2022-10-31 LAB — CERVICOVAGINAL ANCILLARY ONLY
Bacterial Vaginitis (gardnerella): POSITIVE — AB
Candida Glabrata: NEGATIVE
Candida Vaginitis: NEGATIVE
Chlamydia: NEGATIVE
Comment: NEGATIVE
Comment: NEGATIVE
Comment: NEGATIVE
Comment: NEGATIVE
Comment: NEGATIVE
Comment: NORMAL
Neisseria Gonorrhea: NEGATIVE
Trichomonas: NEGATIVE

## 2022-10-31 MED ORDER — METRONIDAZOLE 500 MG PO TABS: 500.0000 mg | ORAL_TABLET | Freq: Two times a day (BID) | ORAL | 0 refills | Status: DC

## 2022-11-28 ENCOUNTER — Encounter: Payer: Self-pay | Admitting: Emergency Medicine

## 2022-11-28 ENCOUNTER — Ambulatory Visit
Admission: EM | Admit: 2022-11-28 | Discharge: 2022-11-28 | Disposition: A | Discharge status: Home / Self Care | Payer: Medicaid Other | Attending: Family Medicine | Admitting: Family Medicine

## 2022-11-28 DIAGNOSIS — J069 Acute upper respiratory infection, unspecified: Secondary | ICD-10-CM | POA: Diagnosis not present

## 2022-11-28 MED ORDER — HYDROCODONE BIT-HOMATROP MBR 5-1.5 MG/5ML PO SOLN: 5.0000 mL | Freq: Four times a day (QID) | ORAL | 0 refills | Status: DC | PRN

## 2022-11-28 NOTE — Discharge Instructions (Signed)

## 2022-11-28 NOTE — ED Provider Notes (Signed)
  [facility]   [id number] 11/28/22 Arrival Time: 1434  ASSESSMENT & PLAN:  1. Viral URI with cough     Discussed typical duration of likely viral illness. Influenza testing unavailable here today. OTC symptom care as needed. No resp distress.  Discharge Medication List as of 11/28/2022  4:38 PM     START taking these medications   Details  HYDROcodone bit-homatropine (HYCODAN) 5-1.5 MG/5ML syrup Take 5 mLs by mouth every 6 (six) hours as needed for cough., Starting Tue 11/28/2022, Print         Follow-up Information     [facility] .   Specialty: Urgent Care Why: If worsening or failing to improve as anticipated. Contact information: [address] [phone] mc [place] [address] [phone]                Reviewed expectations re: course of current medical issues. Questions answered. Outlined signs and symptoms indicating need for more acute intervention. Understanding verbalized. After Visit Summary given.   SUBJECTIVE: History from: Patient. Emily Poole is a 26 y.o. female. Reports: cough, chest cong, subj fever; x 3 days; abrupt onset. Denies: difficulty breathing. Normal PO intake without n/v/d.  OBJECTIVE:  Vitals:   11/28/22 1557  BP: 115/76  Pulse: (!) 111  Resp: 18  Temp: (!) 100.5 F (38.1 C)  SpO2: 98%    Temp and tachycardia noted.  General appearance: alert; no distress Eyes: PERRLA; EOMI; conjunctiva normal HENT: [place]; AT; with nasal congestion Neck: supple  Lungs: speaks full sentences without difficulty; unlabored; ctab Extremities: no edema Skin: warm and dry Neurologic: normal gait Psychological: alert and cooperative; normal mood and affect    No Known Allergies  Past Medical History:  Diagnosis Date   Cervical condyloma    Chlamydia    Hypertension    Pregnancy affected by intrauterine growth retardation (IUGR)    Social History    Socioeconomic History   Marital status: Single    Spouse name: Not on file   Number of children: Not on file   Years of education: Not on file   Highest education level: Not on file  Occupational History   Not on file  Tobacco Use   Smoking status: Never   Smokeless tobacco: Never  Vaping Use   Vaping Use: Never used  Substance and Sexual Activity   Alcohol use: Yes    Comment: occ   Drug use: No   Sexual activity: Yes    Birth control/protection: Implant  Other Topics Concern   Not on file  Social History Narrative   Not on file   Social Determinants of Health   Financial Resource Strain: Not on file  Food Insecurity: Not on file  Transportation Needs: Not on file  Physical Activity: Not on file  Stress: Not on file  Social Connections: Not on file  Intimate Partner Violence: Not on file   Family History  Problem Relation Age of Onset   Diabetes Father    Hypertension Mother    Past Surgical History:  Procedure Laterality Date   CESAREAN SECTION N/A 12/25/2014   Procedure: CESAREAN SECTION;  Surgeon: Lesly Dukes, MD;  Location: [facility];  Service: Obstetrics;  Laterality: N/A;   WISDOM TOOTH EXTRACTION       Mardella Layman, MD 11/28/22 [phone]

## 2022-12-05 ENCOUNTER — Ambulatory Visit: Payer: Medicaid Other

## 2023-01-12 ENCOUNTER — Ambulatory Visit
Admission: RE | Admit: 2023-01-12 | Discharge: 2023-01-12 | Disposition: A | Discharge status: Home / Self Care | Payer: Medicaid Other | Source: Ambulatory Visit | Attending: Internal Medicine | Admitting: Internal Medicine

## 2023-01-12 VITALS — BP 119/77 | HR 87 | Temp 98.0°F | Resp 12

## 2023-01-12 DIAGNOSIS — N898 Other specified noninflammatory disorders of vagina: Secondary | ICD-10-CM | POA: Diagnosis present

## 2023-01-12 DIAGNOSIS — R300 Dysuria: Secondary | ICD-10-CM | POA: Diagnosis not present

## 2023-01-12 DIAGNOSIS — Z113 Encounter for screening for infections with a predominantly sexual mode of transmission: Secondary | ICD-10-CM | POA: Diagnosis present

## 2023-01-12 LAB — POCT URINALYSIS DIP (MANUAL ENTRY)
Bilirubin, UA: NEGATIVE
Blood, UA: NEGATIVE
Glucose, UA: NEGATIVE mg/dL
Ketones, POC UA: NEGATIVE mg/dL
Leukocytes, UA: NEGATIVE
Nitrite, UA: NEGATIVE
Protein Ur, POC: NEGATIVE mg/dL
Spec Grav, UA: >=1.03 — AB (ref 1.010–1.025)
Urobilinogen, UA: NEGATIVE E.U./dL — AB
pH, UA: 7.5 (ref 5.0–8.0)

## 2023-01-12 LAB — POCT URINE PREGNANCY: Preg Test, Ur: NEGATIVE

## 2023-01-12 NOTE — ED Triage Notes (Signed)
Pt is here for possible UTI, possible odor x 1wk

## 2023-01-12 NOTE — Discharge Instructions (Signed)
Pregnancy test was negative.  Urine did not show signs of infection.  Vaginal swab is pending.  Will call if it is abnormal.

## 2023-01-12 NOTE — ED Notes (Signed)
I did not enter URO in urine result  it was 1.0E.U./dl

## 2023-01-12 NOTE — ED Provider Notes (Signed)
[name]    CSN: [id number] Arrival date & time: 01/12/23  1102      History   Chief Complaint Chief Complaint  Patient presents with   Exposure to STD    Entered by patient    HPI Emily Poole is a 27 y.o. female.   Patient presents today for concerns of urinary tract infection.  She reports that she has a "uncomfortable feeling" after she urinates but denies urinary burning, urinary frequency, urinary urgency, hematuria.  Patient reports some mild lower abdominal discomfort but denies back pain, fever, vaginal discharge, abnormal vaginal bleeding.  Patient does report a vaginal odor and has been using boric acid suppositories with no improvement.  The symptoms started approximately 1 week ago per patient.  Last menstrual cycle is unknown given that patient has Nexplanon and has irregular periods.  She thinks that it was approximately 1 month ago but is not sure.  Denies confirmed exposure to STD but has had unprotected sexual intercourse recently.   Exposure to STD    Past Medical History:  Diagnosis Date   Cervical condyloma    Chlamydia    Hypertension    Pregnancy affected by intrauterine growth retardation (IUGR)     Patient Active Problem List   Diagnosis Date Noted   Nexplanon in place 02/22/2021   Family history of breast cancer in first degree relative 02/22/2021   Alcohol abuse 02/22/2021   Cervical condyloma 09/22/2014    Past Surgical History:  Procedure Laterality Date   CESAREAN SECTION N/A 12/25/2014   Procedure: CESAREAN SECTION;  Surgeon: Guss Bunde, MD;  Location: [facility];  Service: Obstetrics;  Laterality: N/A;   WISDOM TOOTH EXTRACTION      OB History     Gravida  1   Para  1   Term      Preterm  1   AB      Living  1      SAB      IAB      Ectopic      Multiple  0   Live Births  1            Home Medications    Prior to Admission medications   Medication Sig Start Date End Date Taking?  Authorizing Provider  Etonogestrel (NEXPLANON [place]) Inject into the skin.   Yes [provider]  albuterol (VENTOLIN HFA) 108 (90 Base) MCG/ACT inhaler Inhale 1-2 puffs into the lungs every 6 (six) hours as needed for wheezing or shortness of breath. 01/14/22   Hazel Sams, PA-C  HYDROcodone bit-homatropine (HYCODAN) 5-1.5 MG/5ML syrup Take 5 mLs by mouth every 6 (six) hours as needed for cough. 11/28/22   Vanessa Kick, MD  metroNIDAZOLE (FLAGYL) 500 MG tablet Take 1 tablet (500 mg total) by mouth 2 (two) times daily. 10/31/22   Lamptey, Myrene Galas, MD    Family History Family History  Problem Relation Age of Onset   Diabetes Father    Hypertension Mother     Social History Social History   Tobacco Use   Smoking status: Never   Smokeless tobacco: Never  Vaping Use   Vaping Use: Never used  Substance Use Topics   Alcohol use: Yes    Comment: occ   Drug use: No     Allergies   Patient has no known allergies.   Review of Systems Review of Systems Per HPI  Physical Exam Triage Vital Signs ED Triage Vitals  Enc Vitals Group     BP 01/12/23 1129 119/77     Pulse Rate 01/12/23 1129 87     Resp 01/12/23 1129 12     Temp 01/12/23 1129 98 F (36.7 C)     Temp Source 01/12/23 1129 Oral     SpO2 01/12/23 1129 98 %     Weight --      Height --      Head Circumference --      Peak Flow --      Pain Score 01/12/23 1127 0     Pain Loc --      Pain Edu? --      Excl. in [place]? --    No data found.  Updated Vital Signs BP 119/77 (BP Location: Right Arm)   Pulse 87   Temp 98 F (36.7 C) (Oral)   Resp 12   SpO2 98%   Visual Acuity Right Eye Distance:   Left Eye Distance:   Bilateral Distance:    Right Eye Near:   Left Eye Near:    Bilateral Near:     Physical Exam Constitutional:      General: She is not in acute distress.    Appearance: Normal appearance. She is not toxic-appearing or diaphoretic.  HENT:     Head: Normocephalic and atraumatic.   Eyes:     Extraocular Movements: Extraocular movements intact.     Conjunctiva/sclera: Conjunctivae normal.  Cardiovascular:     Rate and Rhythm: Normal rate and regular rhythm.     Pulses: Normal pulses.     Heart sounds: Normal heart sounds.  Pulmonary:     Effort: Pulmonary effort is normal. No respiratory distress.     Breath sounds: Normal breath sounds.  Abdominal:     General: Bowel sounds are normal. There is no distension.     Palpations: Abdomen is soft.     Tenderness: There is no abdominal tenderness.  Genitourinary:    Comments: Deferred with shared decision making.  Self swab performed. Neurological:     General: No focal deficit present.     Mental Status: She is alert and oriented to person, place, and time. Mental status is at baseline.  Psychiatric:        Mood and Affect: Mood normal.        Behavior: Behavior normal.        Thought Content: Thought content normal.        Judgment: Judgment normal.      UC Treatments / Results  Labs (all labs ordered are listed, but only abnormal results are displayed) Labs Reviewed  POCT URINALYSIS DIP (MANUAL ENTRY) - Abnormal; Notable for the following components:      Result Value   Color, UA light yellow (*)    Spec Grav, UA >=1.030 (*)    Urobilinogen, UA negative (*)    All other components within normal limits  POCT URINE PREGNANCY  CERVICOVAGINAL ANCILLARY ONLY    EKG   Radiology No results found.  Procedures Procedures (including critical care time)  Medications Ordered in UC Medications - No data to display  Initial Impression / Assessment and Plan / UC Course  I have reviewed the triage vital signs and the nursing notes.  Pertinent labs & imaging results that were available during my care of the patient were reviewed by me and considered in my medical decision making (see chart for details).     UA unremarkable.  Urine pregnancy test negative.  Cervicovaginal swab pending.  Given no  confirmed exposure to STD, will await results for further treatment.  Discussed return precautions.  Patient verbalized understanding and was agreeable with plan. Final Clinical Impressions(s) / UC Diagnoses   Final diagnoses:  Dysuria  Vaginal odor  Screening examination for venereal disease     Discharge Instructions      Pregnancy test was negative.  Urine did not show signs of infection.  Vaginal swab is pending.  Will call if it is abnormal.    ED Prescriptions   None    PDMP not reviewed this encounter.   Teodora Medici, [place] Wheeler 01/12/23 [phone]

## 2023-01-15 LAB — CERVICOVAGINAL ANCILLARY ONLY
Bacterial Vaginitis (gardnerella): POSITIVE — AB
Candida Glabrata: NEGATIVE
Candida Vaginitis: POSITIVE — AB
Chlamydia: NEGATIVE
Comment: NEGATIVE
Comment: NEGATIVE
Comment: NEGATIVE
Comment: NEGATIVE
Comment: NEGATIVE
Comment: NORMAL
Neisseria Gonorrhea: NEGATIVE
Trichomonas: NEGATIVE

## 2023-01-16 ENCOUNTER — Telehealth (HOSPITAL_COMMUNITY): Payer: Self-pay | Admitting: Emergency Medicine

## 2023-01-16 MED ORDER — FLUCONAZOLE 150 MG PO TABS: 150.0000 mg | ORAL_TABLET | Freq: Once | ORAL | 0 refills | Status: AC

## 2023-01-16 MED ORDER — METRONIDAZOLE 500 MG PO TABS: 500.0000 mg | ORAL_TABLET | Freq: Two times a day (BID) | ORAL | 0 refills | Status: DC

## 2023-05-06 ENCOUNTER — Ambulatory Visit: Payer: Medicaid Other

## 2023-05-31 ENCOUNTER — Ambulatory Visit: Payer: Medicaid Other

## 2023-06-05 ENCOUNTER — Other Ambulatory Visit: Payer: Self-pay | Admitting: Internal Medicine

## 2023-06-06 LAB — C. TRACHOMATIS/N. GONORRHOEAE RNA
C. trachomatis RNA, TMA: NOT DETECTED
N. gonorrhoeae RNA, TMA: NOT DETECTED

## 2023-09-17 ENCOUNTER — Ambulatory Visit: Payer: Medicaid Other

## 2023-09-21 ENCOUNTER — Ambulatory Visit: Payer: Self-pay

## 2023-10-06 ENCOUNTER — Ambulatory Visit: Payer: Self-pay

## 2023-10-08 ENCOUNTER — Ambulatory Visit: Payer: Self-pay

## 2023-10-09 ENCOUNTER — Ambulatory Visit: Payer: Self-pay

## 2023-10-10 ENCOUNTER — Ambulatory Visit
Admission: RE | Admit: 2023-10-10 | Discharge: 2023-10-10 | Disposition: A | Discharge status: Home / Self Care | Payer: Self-pay | Source: Ambulatory Visit | Attending: Internal Medicine | Admitting: Internal Medicine

## 2023-10-10 VITALS — BP 115/74 | HR 73 | Temp 97.9°F | Resp 16

## 2023-10-10 DIAGNOSIS — N898 Other specified noninflammatory disorders of vagina: Secondary | ICD-10-CM

## 2023-10-10 DIAGNOSIS — Z113 Encounter for screening for infections with a predominantly sexual mode of transmission: Secondary | ICD-10-CM

## 2023-10-10 LAB — POCT URINALYSIS DIP (MANUAL ENTRY)
Bilirubin, UA: NEGATIVE
Blood, UA: NEGATIVE
Glucose, UA: NEGATIVE mg/dL
Ketones, POC UA: NEGATIVE mg/dL
Leukocytes, UA: NEGATIVE
Nitrite, UA: NEGATIVE
Protein Ur, POC: NEGATIVE mg/dL
Spec Grav, UA: 1.025 (ref 1.010–1.025)
Urobilinogen, UA: 0.2 U/dL
pH, UA: 6.5 (ref 5.0–8.0)

## 2023-10-10 LAB — POCT URINE PREGNANCY: Preg Test, Ur: NEGATIVE

## 2023-10-10 NOTE — ED Triage Notes (Signed)
Pt prsents to UC w/ c/o lower abd pressure x2 days and thicker vaginal discharge x4 days. Denies odor.

## 2023-10-10 NOTE — ED Provider Notes (Signed)
[facility]    CSN: [id number] Arrival date & time: 10/10/23  4782      History   Chief Complaint Chief Complaint  Patient presents with   Vaginal Discharge    Entered by patient    HPI Emily Poole is a 27 y.o. female presents for vaginal discharge.  Patient reports 2 days of a thicker nonmalodorous nonpruritic vaginal discharge as well as some suprapubic pressure.  Endorses some urinary frequency but denies burning with urination, urgency, hematuria, fevers, nausea/vomiting, flank pain.  No STD exposure but would like screening while here.  She did take OTC Azo with no improvement.  No other concerns at this time.   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 Authorizing Provider  Etonogestrel (NEXPLANON [place]) Inject into the skin.   Yes [provider]  albuterol (VENTOLIN HFA) 108 (90 Base) MCG/ACT inhaler Inhale 1-2 puffs into the lungs every 6 (six) hours as  needed for wheezing or shortness of breath. 01/14/22   Rhys Martini, PA-C  HYDROcodone bit-homatropine (HYCODAN) 5-1.5 MG/5ML syrup Take 5 mLs by mouth every 6 (six) hours as needed for cough. 11/28/22   Mardella Layman, MD  metroNIDAZOLE (FLAGYL) 500 MG tablet Take 1 tablet (500 mg total) by mouth 2 (two) times daily. 01/16/23   Lamptey, Britta Mccreedy, MD    Family History Family History  Problem Relation Age of Onset   Diabetes Father    Hypertension Mother     Social History Social History   Tobacco Use   Smoking status: Never   Smokeless tobacco: Never  Vaping Use   Vaping status: Never Used  Substance Use Topics   Alcohol use: Yes    Comment: occ   Drug use: No     Allergies   Patient has no known allergies.   Review of Systems Review of Systems  Genitourinary:  Positive for frequency and vaginal discharge.     Physical Exam Triage Vital Signs ED Triage Vitals  Encounter Vitals Group     BP 10/10/23 1001 115/74     Systolic BP Percentile --      Diastolic BP Percentile --  Pulse Rate 10/10/23 1001 73     Resp 10/10/23 1001 16     Temp 10/10/23 1001 97.9 F (36.6 C)     Temp Source 10/10/23 1001 Oral     SpO2 10/10/23 1001 95 %     Weight --      Height --      Head Circumference --      Peak Flow --      Pain Score 10/10/23 0959 0     Pain Loc --      Pain Education --      Exclude from Growth Chart --    No data found.  Updated Vital Signs BP 115/74 (BP Location: Right Arm)   Pulse 73   Temp 97.9 F (36.6 C) (Oral)   Resp 16   LMP 08/10/2023 (Approximate)   SpO2 95%   Visual Acuity Right Eye Distance:   Left Eye Distance:   Bilateral Distance:    Right Eye Near:   Left Eye Near:    Bilateral Near:     Physical Exam Vitals and nursing note reviewed.  Constitutional:      Appearance: Normal appearance.  HENT:     Head: Normocephalic and atraumatic.  Eyes:     Pupils: Pupils are equal, round, and reactive to light.   Cardiovascular:     Rate and Rhythm: Normal rate.  Pulmonary:     Effort: Pulmonary effort is normal.  Abdominal:     Tenderness: There is no right CVA tenderness or left CVA tenderness.  Skin:    General: Skin is warm and dry.  Neurological:     General: No focal deficit present.     Mental Status: She is alert and oriented to person, place, and time.  Psychiatric:        Mood and Affect: Mood normal.        Behavior: Behavior normal.      UC Treatments / Results  Labs (all labs ordered are listed, but only abnormal results are displayed) Labs Reviewed  RPR  HIV ANTIBODY (ROUTINE TESTING W REFLEX)  POCT URINE PREGNANCY  POCT URINALYSIS DIP (MANUAL ENTRY)  CERVICOVAGINAL ANCILLARY ONLY    EKG   Radiology No results found.  Procedures Procedures (including critical care time)  Medications Ordered in UC Medications - No data to display  Initial Impression / Assessment and Plan / UC Course  I have reviewed the triage vital signs and the nursing notes.  Pertinent labs & imaging results that were available during my care of the patient were reviewed by me and considered in my medical decision making (see chart for details).     Reviewed exam and symptoms with patient.  No red flags.  Negative UA and urine hCG pregnancy.  Vaginal swab and will contact for any positive results.  Patient prefers to await results prior to initiating any treatment.  PCP or GYN follow-up if symptoms do not improve.  ER precautions reviewed. Final Clinical Impressions(s) / UC Diagnoses   Final diagnoses:  Vaginal discharge  Screening examination for STD (sexually transmitted disease)     Discharge Instructions      The clinic will contact you with results of the testing done today if positive.  Please follow-up with your PCP or gynecologist if symptoms do not improve.  Please go to the ER for any worsening symptoms.  I hope you feel better soon!    ED Prescriptions   None     PDMP not  reviewed this encounter.   Radford Pax, NP 10/10/23 1017

## 2023-10-10 NOTE — Discharge Instructions (Addendum)
The clinic will contact you with results of the testing done today if positive.  Please follow-up with your PCP or gynecologist if symptoms do not improve.  Please go to the ER for any worsening symptoms.  I hope you feel better soon!

## 2023-10-11 LAB — CERVICOVAGINAL ANCILLARY ONLY
Bacterial Vaginitis (gardnerella): POSITIVE — AB
Candida Glabrata: NEGATIVE
Candida Vaginitis: NEGATIVE
Chlamydia: NEGATIVE
Comment: NEGATIVE
Comment: NEGATIVE
Comment: NEGATIVE
Comment: NEGATIVE
Comment: NEGATIVE
Comment: NORMAL
Neisseria Gonorrhea: NEGATIVE
Trichomonas: NEGATIVE

## 2023-10-11 LAB — HIV ANTIBODY (ROUTINE TESTING W REFLEX): HIV Screen 4th Generation wRfx: NONREACTIVE

## 2023-10-11 LAB — RPR: RPR Ser Ql: NONREACTIVE

## 2023-10-12 ENCOUNTER — Telehealth (HOSPITAL_COMMUNITY): Payer: Self-pay | Admitting: Emergency Medicine

## 2023-10-12 MED ORDER — METRONIDAZOLE 500 MG PO TABS: 500.0000 mg | ORAL_TABLET | Freq: Two times a day (BID) | ORAL | 0 refills | Status: DC

## 2023-10-12 NOTE — Telephone Encounter (Signed)
Metronidazole for positive BV

## 2023-10-20 ENCOUNTER — Ambulatory Visit: Payer: Self-pay

## 2023-12-05 NOTE — L&D Delivery Note (Signed)
 Patient complete and pushing. SVD of non-viable female infant over intact perineum.  Cord clamped x 2, cut.  Placenta remains in situ. Cytotec  800 given rectally IV pitocin  bolus.

## 2024-01-11 ENCOUNTER — Ambulatory Visit: Payer: Self-pay

## 2024-01-24 ENCOUNTER — Ambulatory Visit: Payer: Self-pay

## 2024-02-12 ENCOUNTER — Encounter: Payer: Self-pay | Admitting: Obstetrics and Gynecology

## 2024-02-12 ENCOUNTER — Ambulatory Visit: Payer: Self-pay | Admitting: Obstetrics and Gynecology

## 2024-02-12 ENCOUNTER — Ambulatory Visit

## 2024-02-12 VITALS — BP 116/75 | HR 74 | Ht 63.5 in | Wt 156.0 lb

## 2024-02-12 DIAGNOSIS — Z3046 Encounter for surveillance of implantable subdermal contraceptive: Secondary | ICD-10-CM

## 2024-02-12 NOTE — Progress Notes (Signed)
     GYNECOLOGY OFFICE PROCEDURE NOTE  Emily SUKHU is a 28 y.o. G1P0101 here for Nexplanon removal, patient reports the nexplanon has been in place for 6-9 years, however note in place for insertion on 02/11/2021   Nexplanon Removal Patient identified, informed consent performed, consent signed.   Appropriate time out taken. Nexplanon site identified.  Area prepped in usual sterile fashon. One ml of 1% lidocaine was used to anesthetize the area at the distal end of the implant. A small stab incision was made right beside the implant on the distal portion. A 2nd stab incision was made inferior to initial.   The Nexplanon rod was not located. At one point the rod was palpated however exteremly deep within the tissue.  There was minimal blood loss. Steri-strips were applied over the small incision.  A pressure bandage was applied to reduce any bruising.  The patient tolerated the procedure well and was given post procedure instructions.  Message sent to Primary care for assistance with removal via Ultrasound guided. Appreciate their assistance.     Hilliary Jock, Harolyn Rutherford, NP [facility] [employer], [facility]

## 2024-02-18 ENCOUNTER — Ambulatory Visit: Admitting: Sports Medicine

## 2024-02-18 ENCOUNTER — Encounter: Payer: Self-pay | Admitting: Sports Medicine

## 2024-02-18 ENCOUNTER — Ambulatory Visit (INDEPENDENT_AMBULATORY_CARE_PROVIDER_SITE_OTHER): Admitting: Sports Medicine

## 2024-02-18 DIAGNOSIS — Z975 Presence of (intrauterine) contraceptive device: Secondary | ICD-10-CM | POA: Diagnosis not present

## 2024-02-18 MED ORDER — HYDROCODONE-ACETAMINOPHEN 5-325 MG PO TABS: 1.0000 | ORAL_TABLET | Freq: Three times a day (TID) | ORAL | 0 refills | Status: DC | PRN

## 2024-02-18 MED ORDER — CEPHALEXIN 500 MG PO CAPS: 500.0000 mg | ORAL_CAPSULE | Freq: Two times a day (BID) | ORAL | 0 refills | Status: DC

## 2024-02-18 NOTE — Patient Instructions (Signed)
Incision Care, Adult An incision is a surgical cut that is made through your skin. Most incisions are closed after a surgical procedure. Your incision may be closed with stitches (sutures), staples, skin glue, or adhesive strips. You may need to return to your health care provider to have sutures or staples removed. This may occur several days or several weeks after your surgery. Until then, the incision needs to be cared for properly to prevent infection. Follow instructions from your health care provider about how to care for your incision. Supplies needed: Soap, water, and a clean hand towel. Wound cleanser. A clean bandage (dressing), if needed. Cream or ointment, if told by your health care provider. Clean gauze. How to care for your incision Cleaning the incision Ask your health care provider how to clean the incision. This may include: Wearing medical gloves. Using mild soap and water, or wound cleanser. Using a clean gauze to pat the incision dry after cleaning it. Dressing changes Wash your hands with soap and water for at least 20 seconds before and after you change the dressing. If soap and water are not available, use hand sanitizer. Do not use disinfectants or antiseptics, such as rubbing alcohol, to clean open wounds unless told by your health care provider. Change your dressing as told by your health care provider. Leave sutures, staples, skin glue, or adhesive strips in place. These skin closures may need to stay in place for 2 weeks or longer. If adhesive strip edges start to loosen and curl up, you may trim the loose edges. Do not remove adhesive strips completely unless your health care provider tells you to do that. Apply cream or ointment. Do this only as told by your health care provider. Cover the incision with a clean dressing. Ask your health care provider when you can start to leave the incision uncovered. Checking for infection Check your incision area every day for  signs of infection. Check for: More redness, swelling, or pain. More fluid or blood. New warmth, hardness, or a rash that develops along the incision. Pus or a bad smell.  Follow these instructions at home Medicines Take over-the-counter and prescription medicines only as told by your health care provider. If you were prescribed an antibiotic medicine, cream, or ointment, take or apply it as told by your health care provider. Do not stop using the antibiotic even if your condition improves. Eating and drinking Eat a diet that includes protein, vitamin A, vitamin C, and other nutrient-rich foods to help the wound heal. Foods rich in protein include meat, fish, eggs, dairy, beans, nuts, and protein supplement drinks. Foods rich in vitamin A include carrots and dark green, leafy vegetables. Foods rich in vitamin C include citrus fruits, tomatoes, broccoli, and peppers. Drink enough fluid to keep your urine pale yellow. General instructions  Do not take baths, swim, use a hot tub, or do anything that would put the incision underwater until your health care provider approves. Ask your health care provider if you may take showers. You may only be allowed to take sponge baths. Limit movement around your incision to promote healing. Avoid straining, lifting, or exercising for the first 2 weeks after your procedure, or for as long as told by your health care provider. Return to your normal activities as told by your health care provider. Ask your health care provider what activities are safe for you. Do not scratch, pick, or scrub the incision. Keep it covered as told by your health care provider.   Protect your incision from the sun when you are outside for the first 6 months, or for as long as told by your health care provider. Cover up the scar area or apply sunscreen that has an SPF of at least 30. Do not use any products that contain nicotine or tobacco, such as cigarettes, e-cigarettes, and  chewing tobacco. These can delay incision healing. If you need help quitting, ask your health care provider. Keep all follow-up visits. This is important. Contact a health care provider if: You have any of these signs of infection: More redness, swelling, or pain around your incision. More fluid or blood coming from your incision. New warmth or hardness around your incision. Pus or a bad smell coming from your incision. A rash that develops along the incision. You feel nauseous or you vomit. You are dizzy. Your sutures, staples, skin glue, or adhesive strips come undone. Your wound gets bigger. You have a fever. Get help right away if: Your incision bleeds through the dressing and the bleeding does not stop with gentle pressure. The edges of your incision open up and separate. These symptoms may represent a serious problem that is an emergency. Do not wait to see if the symptoms will go away. Get medical help right away. Call your local emergency services (911 in the [place] Do not drive yourself to the hospital. Summary Follow instructions from your health care provider about how to care for your incision. Wash your hands with soap and water for at least 20 seconds before and after you change the dressing. If soap and water are not available, use hand sanitizer. Check your incision area every day for signs of infection. Keep all follow-up visits. This is important. This information is not intended to replace advice given to you by your health care provider. Make sure you discuss any questions you have with your health care provider. Document Revised: 02/21/2021 Document Reviewed: 02/21/2021 Elsevier Patient Education  2024 Elsevier Inc.  

## 2024-02-18 NOTE — Assessment & Plan Note (Addendum)
 This is a very pleasant 28 year old female, she had a Nexplanon placed 02/11/2021, left arm. She had an attempted removal by gynecology on 02/12/2024, she was referred to me for ultrasound-guided Nexplanon removal. On exam she has 2 visible incisions from the prior removal attempt, on extensive palpation I was able to feel the Nexplanon device essentially in between the 2 prior incisions. Ultrasound confirmed the location and the proximal and distal ends were marked. I injected approximately 5 mL of lidocaine with epi at the proximal and distal ends of the Nexplanon as well as underneath the Nexplanon, the Nexplanon was easily removed, please see procedure note above, I also revised the incision excising the previous 2 incisions as well as my new incision making it a single incision and then closing it with 5 simple interrupted sutures. Hydrocodone for pain, Keflex due to the previous open incisions and aftercare given. She will return in 10 days for suture removal.

## 2024-02-18 NOTE — Progress Notes (Signed)
    Procedures performed today:    Procedure: Removal of left arm Nexplanon device with ultrasound guidance Risks, benefits, and alternatives explained and consent obtained. Time out conducted. We performed confirmation and localization with ultrasound. The area of hypoechoic change with acoustic shadowing consistent with the Nexplanon device was located between the 2 previous incisions. Surface prepped with chlorhexidine. 5cc lidocaine with epinephine infiltrated in a field block. Adequate anesthesia ensured. Area prepped and draped in a sterile fashion. I made a stab incision with a #11 blade, then using blunt dissection I was able to uncover the Nexplanon device which was removed. I then proceeded with the scar revision. Pt stable.  Procedure: Revision of left upper inner arm scar x 3, 1 inch (excision benign skin lesion) Risks, benefits, and alternatives explained and consent obtained. Time out conducted. Prep had already been performed prior with the Nexplanon removal. Using a #10 blade I made elliptical incision encompassing both prior incisions from attempted Nexplanon removal and my stab incision today, I removed the associated skin, then using blunt dissection I undermined the edges of the wound to reduce tension, I then closed the skin defect with #5 simple interrupted 4-0 Ethilon sutures. Hemostasis achieved. Pt stable.  Independent interpretation of notes and tests performed by another provider:   None.  Brief History, Exam, Impression, and Recommendations:    Nexplanon in place This is a very pleasant 28 year old female, she had a Nexplanon placed 02/11/2021, left arm. She had an attempted removal by gynecology on 02/12/2024, she was referred to me for ultrasound-guided Nexplanon removal. On exam she has 2 visible incisions from the prior removal attempt, on extensive palpation I was able to feel the Nexplanon device essentially in between the 2 prior  incisions. Ultrasound confirmed the location and the proximal and distal ends were marked. I injected approximately 5 mL of lidocaine with epi at the proximal and distal ends of the Nexplanon as well as underneath the Nexplanon, the Nexplanon was easily removed, please see procedure note above, I also revised the incision excising the previous 2 incisions as well as my new incision making it a single incision and then closing it with 5 simple interrupted sutures. Hydrocodone for pain, Keflex due to the previous open incisions and aftercare given. She will return in 10 days for suture removal.  The decision was made to proceed with the minor surgery above.  ____________________________________________ Ihor Austin. Benjamin Stain, M.D., ABFM., CAQSM., AME. Primary Care and Sports Medicine [facility]  Adjunct Professor of Family Medicine  [employer] [facility] [employer] [facility] [employer]  Restaurant manager, fast food

## 2024-02-28 ENCOUNTER — Ambulatory Visit (INDEPENDENT_AMBULATORY_CARE_PROVIDER_SITE_OTHER): Admitting: Sports Medicine

## 2024-02-28 ENCOUNTER — Encounter: Payer: Self-pay | Admitting: Sports Medicine

## 2024-02-28 DIAGNOSIS — Z975 Presence of (intrauterine) contraceptive device: Secondary | ICD-10-CM

## 2024-02-28 NOTE — Assessment & Plan Note (Signed)
 Previous history: This is a very pleasant 28 year old female, she had a Nexplanon placed 02/11/2021, left arm. She had an attempted removal by gynecology on 02/12/2024, she was referred to me for ultrasound-guided Nexplanon removal. On exam she has 2 visible incisions from the prior removal attempt, on extensive palpation I was able to feel the Nexplanon device essentially in between the 2 prior incisions. Ultrasound confirmed the location and the proximal and distal ends were marked. I injected approximately 5 mL of lidocaine with epi at the proximal and distal ends of the Nexplanon as well as underneath the Nexplanon, the Nexplanon was easily removed, please see procedure note above, I also revised the incision excising the previous 2 incisions as well as my new incision making it a single incision and then closing it with 5 simple interrupted sutures. Hydrocodone for pain, Keflex due to the previous open incisions and aftercare given. She will return in 10 days for suture removal.  History today 02/28/2024: 5 sutures were removed, incision is clean, dry, intact, Dermabond applied, return to see me as needed.

## 2024-02-28 NOTE — Progress Notes (Signed)
    Procedures performed today:    None.  Independent interpretation of notes and tests performed by another provider:   None.  Brief History, Exam, Impression, and Recommendations:    Nexplanon in place Previous history: This is a very pleasant 28 year old female, she had a Nexplanon placed 02/11/2021, left arm. She had an attempted removal by gynecology on 02/12/2024, she was referred to me for ultrasound-guided Nexplanon removal. On exam she has 2 visible incisions from the prior removal attempt, on extensive palpation I was able to feel the Nexplanon device essentially in between the 2 prior incisions. Ultrasound confirmed the location and the proximal and distal ends were marked. I injected approximately 5 mL of lidocaine with epi at the proximal and distal ends of the Nexplanon as well as underneath the Nexplanon, the Nexplanon was easily removed, please see procedure note above, I also revised the incision excising the previous 2 incisions as well as my new incision making it a single incision and then closing it with 5 simple interrupted sutures. Hydrocodone for pain, Keflex due to the previous open incisions and aftercare given. She will return in 10 days for suture removal.  History today 02/28/2024: 5 sutures were removed, incision is clean, dry, intact, Dermabond applied, return to see me as needed.    ____________________________________________ Ihor Austin. Benjamin Stain, M.D., ABFM., CAQSM., AME. Primary Care and Sports Medicine [facility]  Adjunct Professor of Family Medicine  [employer]  Restaurant manager, fast food

## 2024-03-25 ENCOUNTER — Ambulatory Visit

## 2024-03-25 DIAGNOSIS — Z3491 Encounter for supervision of normal pregnancy, unspecified, first trimester: Secondary | ICD-10-CM

## 2024-03-25 DIAGNOSIS — Z3201 Encounter for pregnancy test, result positive: Secondary | ICD-10-CM | POA: Diagnosis not present

## 2024-03-25 DIAGNOSIS — Z369 Encounter for antenatal screening, unspecified: Secondary | ICD-10-CM

## 2024-03-25 LAB — POCT URINE PREGNANCY: Preg Test, Ur: POSITIVE — AB

## 2024-03-25 NOTE — Progress Notes (Addendum)
 Patient came in the office for urine pregnancy test--Positive results.

## 2024-04-01 ENCOUNTER — Ambulatory Visit

## 2024-04-01 ENCOUNTER — Other Ambulatory Visit: Payer: Self-pay

## 2024-04-01 DIAGNOSIS — Z369 Encounter for antenatal screening, unspecified: Secondary | ICD-10-CM

## 2024-04-01 DIAGNOSIS — Z3A01 Less than 8 weeks gestation of pregnancy: Secondary | ICD-10-CM | POA: Diagnosis not present

## 2024-05-08 ENCOUNTER — Ambulatory Visit: Admitting: Obstetrics and Gynecology

## 2024-05-08 ENCOUNTER — Encounter: Payer: Self-pay | Admitting: Obstetrics and Gynecology

## 2024-05-08 ENCOUNTER — Other Ambulatory Visit (HOSPITAL_COMMUNITY)
Admission: RE | Admit: 2024-05-08 | Discharge: 2024-05-08 | Disposition: A | Discharge status: Home / Self Care | Source: Ambulatory Visit | Attending: Obstetrics and Gynecology | Admitting: Obstetrics and Gynecology

## 2024-05-08 VITALS — BP 119/78 | HR 72 | Wt 153.0 lb

## 2024-05-08 DIAGNOSIS — Z3A12 12 weeks gestation of pregnancy: Secondary | ICD-10-CM | POA: Diagnosis not present

## 2024-05-08 DIAGNOSIS — O09291 Supervision of pregnancy with other poor reproductive or obstetric history, first trimester: Secondary | ICD-10-CM

## 2024-05-08 DIAGNOSIS — Z1331 Encounter for screening for depression: Secondary | ICD-10-CM

## 2024-05-08 DIAGNOSIS — R87619 Unspecified abnormal cytological findings in specimens from cervix uteri: Secondary | ICD-10-CM

## 2024-05-08 DIAGNOSIS — O0991 Supervision of high risk pregnancy, unspecified, first trimester: Secondary | ICD-10-CM

## 2024-05-08 DIAGNOSIS — F1010 Alcohol abuse, uncomplicated: Secondary | ICD-10-CM

## 2024-05-08 DIAGNOSIS — Z98891 History of uterine scar from previous surgery: Secondary | ICD-10-CM

## 2024-05-08 DIAGNOSIS — O09299 Supervision of pregnancy with other poor reproductive or obstetric history, unspecified trimester: Secondary | ICD-10-CM | POA: Insufficient documentation

## 2024-05-08 MED ORDER — DOXYLAMINE-PYRIDOXINE 10-10 MG PO TBEC: 2.0000 | DELAYED_RELEASE_TABLET | Freq: Every day | ORAL | 5 refills | Status: DC

## 2024-05-08 MED ORDER — ASPIRIN 81 MG PO CHEW: 162.0000 mg | CHEWABLE_TABLET | Freq: Every day | ORAL | 3 refills | Status: AC

## 2024-05-08 MED ORDER — METOCLOPRAMIDE HCL 10 MG PO TABS: 10.0000 mg | ORAL_TABLET | Freq: Four times a day (QID) | ORAL | 1 refills | Status: DC

## 2024-05-08 MED ORDER — SCOPOLAMINE 1 MG/3DAYS TD PT72: 1.0000 | MEDICATED_PATCH | TRANSDERMAL | 0 refills | Status: DC

## 2024-05-08 NOTE — Patient Instructions (Signed)
Our practice his participating in a study that provides no-cost doula care. ACURE4Moms is a study looking at how doula care can reduce birthing disparities for Black and brown birthing people. We like to refer patients as soon as possible, but definitely before 28 weeks so patients can get to know their doula.    A doula is trained to provide support before, during and just after you give birth. While doulas do not provide medical care, they do provide emotional, physical and educational support. Doulas can help reduce your stress and comfort you and your partner. They can help you cope with labor by helping you use breathing techniques, massage, creative labor positioning, essential oils and affirmations.   ACURE4Moms is a research study trying to reduce:   low birthweight babies  emergency department visits & hospitalizations for birthing persons and their babies  depression among birthing people  discrimination in pregnancy-related care ACURE4Moms is trying out 2 programs designed by  people who have given birth. These programs include: 1. Sharing patient data and warning alerts with clinic staff to keep them accountable for their patients' outcomes and providing tools to help them  reduce bias in care. 2. Matching eligible patients with doulas from the  same community as the patients.  If you would like to participate in this study, please visit:   [email or web address]

## 2024-05-08 NOTE — Progress Notes (Signed)
 History:  Emily Poole is a 28 y.o. G2P0101 at [redacted]w[redacted]d by early ultrasound being seen today for her first obstetrical visit.   Patient does intend to breast feed.   Pregnancy history fully reviewed. Obstetrical history is significant for: FGR and c-section at 28 weeks. She was scheduled by [facility] due to BPP 2/10 and REDF. C-section was in 2016 by Dr. Shira Dopp.  Alcohol  abuse- not drinking. She notes she smokes marijuana.    Patient reports metallic taste in her mouth. Aaron Aas  HISTORY: OB History  Gravida Para Term Preterm AB Living  2 1 0 1 0 1  SAB IAB Ectopic Multiple Live Births  0 0 0 0 1    # Outcome Date GA Lbr Len/2nd Weight Sex Type Anes PTL Lv  2 Current           1 Preterm 12/25/14 [redacted]w[redacted]d  1 lb 12.2 oz (0.799 kg) F CS-LTranv Spinal  LIV     Apgar1: 7  Apgar5: 8     Lab Results  Component Value Date   DIAGPAP (A) 02/22/2021    - High grade squamous intraepithelial lesion (HSIL)     Past Medical History:  Diagnosis Date   Cervical condyloma    Hypertension    Pregnancy affected by intrauterine growth retardation (IUGR)    Past Surgical History:  Procedure Laterality Date   CESAREAN SECTION N/A 12/25/2014   Procedure: CESAREAN SECTION;  Surgeon: Rik Chasten, MD;  Location: [facility];  Service: Obstetrics;  Laterality: N/A;   WISDOM TOOTH EXTRACTION     Family History  Problem Relation Age of Onset   Diabetes Father    Hypertension Mother    Social History   Tobacco Use   Smoking status: Never   Smokeless tobacco: Never  Vaping Use   Vaping status: Never Used  Substance Use Topics   Alcohol  use: Yes    Comment: occ   Drug use: Yes    Frequency: 7.0 times per week    Types: Marijuana    Comment: mornings to help with nausea   No Known Allergies Current Outpatient Medications on File Prior to Visit  Medication Sig Dispense Refill   albuterol  (VENTOLIN  HFA) 108 (90 Base) MCG/ACT inhaler Inhale 1-2 puffs into the lungs every 6 (six) hours as needed for  wheezing or shortness of breath. 1 each 0   Prenatal Vit-Fe Fumarate-FA (PRENATAL PO) Take 1 tablet by mouth daily.     No current facility-administered medications on file prior to visit.    Review of Systems Pertinent items noted in HPI and remainder of comprehensive ROS otherwise negative.  Physical Exam:   Vitals:   05/08/24 0956  BP: 119/78  Pulse: 72  Weight: 153 lb (69.4 kg)      General: well-developed, well-nourished female in no acute distress  Breasts:  normal appearance, no masses or tenderness bilaterally  Skin: normal coloration and turgor, no rashes  Neurologic: oriented, normal, negative, normal mood  Extremities: normal strength, tone, and muscle mass, ROM of all joints is normal  HEENT PERRLA, extraocular movement intact and sclera clear, anicteric  Neck supple and no masses  Cardiovascular: regular rate and rhythm  Respiratory:  no respiratory distress, normal breath sounds  Abdomen: soft, non-tender; bowel sounds normal; no masses,  no organomegaly  Pelvic: normal external genitalia, no lesions, normal vaginal mucosa, normal vaginal discharge, normal cervix, pap smear done. 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 Supervision of pregnancy, antepartum (Primary) Will try diclegis and reglan for her nausea. For the metallic taste, we will try scopolamine  patch. If insufficient, we also discussed zofran  for nausea and robinul for saliva.  We discussed birth control. She is  considering LARC option - between Depo and IUD. She does not want the Nexplanon . Discussed types of IUDs, side effects and length of use.   2. History of IUGR (intrauterine growth retardation) and stillbirth, currently pregnant, unspecified trimester Will need serial growth US . Pregnancy is well dated.  Recommended ldASA 162 mg until delivery starting now.   3. History of cesarean delivery At 28w as noted above. We discussed option for TOLAC. She will consider.   4. Pregnancy with 12 completed weeks gestation Initial labs ordered. Discussed ACURE4MOMs. Pt informed about study and invite information given.  Continue prenatal vitamins. Problem list reviewed and updated. Genetic Screening discussed, NIPS: ordered. Ultrasound discussed; fetal anatomic survey: ordered. Anticipatory guidance about prenatal visits given including labs, ultrasounds, and testing. Discussed usage of Babyscripts and virtual visits  5. Alcohol  abuse None during this pregnancy. She does report marijuana use.   The nature of [facility]Faculty Practice with multiple MDs and Advanced Practice Providers was explained to patient; also emphasized that residents, students are part of our team. Routine obstetric precautions reviewed. Encouraged to seek out care at office or emergency room [facility] preferred) for urgent and/or emergent concerns. Return in about 4 weeks (around 06/05/2024) for OB VISIT, MD or APP.    Lacey Pian, MD, FACOG Obstetrician & Gynecologist, [facility] for [facility], [facility]

## 2024-05-10 LAB — URINE CULTURE

## 2024-05-10 LAB — HEMOGLOBIN A1C
Est. average glucose Bld gHb Est-mCnc: 111 mg/dL
Hgb A1c MFr Bld: 5.5 % (ref 4.8–5.6)

## 2024-05-10 LAB — CBC/D/PLT+RPR+RH+ABO+RUBIGG...
Antibody Screen: NEGATIVE
Basophils Absolute: 0 10*3/uL (ref 0.0–0.2)
Basos: 0 %
EOS (ABSOLUTE): 0.1 10*3/uL (ref 0.0–0.4)
Eos: 1 %
HCV Ab: NONREACTIVE
HIV Screen 4th Generation wRfx: NONREACTIVE
Hematocrit: 34.3 % (ref 34.0–46.6)
Hemoglobin: 11.4 g/dL (ref 11.1–15.9)
Hepatitis B Surface Ag: NEGATIVE
Immature Grans (Abs): 0 10*3/uL (ref 0.0–0.1)
Immature Granulocytes: 0 %
Lymphocytes Absolute: 1.8 10*3/uL (ref 0.7–3.1)
Lymphs: 26 %
MCH: 30.7 pg (ref 26.6–33.0)
MCHC: 33.2 g/dL (ref 31.5–35.7)
MCV: 93 fL (ref 79–97)
Monocytes Absolute: 0.5 10*3/uL (ref 0.1–0.9)
Monocytes: 8 %
Neutrophils Absolute: 4.5 10*3/uL (ref 1.4–7.0)
Neutrophils: 65 %
Platelets: 237 10*3/uL (ref 150–450)
RBC: 3.71 x10E6/uL — ABNORMAL LOW (ref 3.77–5.28)
RDW: 11.9 % (ref 11.7–15.4)
RPR Ser Ql: NONREACTIVE
Rh Factor: POSITIVE
Rubella Antibodies, IGG: 3.36 {index} (ref >0.99)
WBC: 6.8 10*3/uL (ref 3.4–10.8)

## 2024-05-10 LAB — COMPREHENSIVE METABOLIC PANEL WITH GFR
ALT: 15 IU/L (ref 0–32)
AST: 19 IU/L (ref 0–40)
Albumin: 4.3 g/dL (ref 4.0–5.0)
Alkaline Phosphatase: 64 IU/L (ref 44–121)
BUN/Creatinine Ratio: 8 — ABNORMAL LOW (ref 9–23)
BUN: 5 mg/dL — ABNORMAL LOW (ref 6–20)
Bilirubin Total: 0.4 mg/dL (ref 0.0–1.2)
CO2: 19 mmol/L — ABNORMAL LOW (ref 20–29)
Calcium: 9.4 mg/dL (ref 8.7–10.2)
Chloride: 102 mmol/L (ref 96–106)
Creatinine, Ser: 0.6 mg/dL (ref 0.57–1.00)
Globulin, Total: 2.6 g/dL (ref 1.5–4.5)
Glucose: 79 mg/dL (ref 70–99)
Potassium: 4.2 mmol/L (ref 3.5–5.2)
Sodium: 136 mmol/L (ref 134–144)
Total Protein: 6.9 g/dL (ref 6.0–8.5)
eGFR: 126 mL/min/{1.73_m2} (ref >59)

## 2024-05-10 LAB — PROTEIN / CREATININE RATIO, URINE
Creatinine, Urine: 63.2 mg/dL
Protein, Ur: 5 mg/dL
Protein/Creat Ratio: 79 mg/g{creat} (ref 0–200)

## 2024-05-10 LAB — HCV INTERPRETATION

## 2024-05-12 ENCOUNTER — Ambulatory Visit: Payer: Self-pay | Admitting: Obstetrics and Gynecology

## 2024-05-12 DIAGNOSIS — R87619 Unspecified abnormal cytological findings in specimens from cervix uteri: Secondary | ICD-10-CM

## 2024-05-23 LAB — PANORAMA PRENATAL TEST FULL PANEL:PANORAMA TEST PLUS 5 ADDITIONAL MICRODELETIONS: FETAL FRACTION: 7.2

## 2024-06-05 ENCOUNTER — Ambulatory Visit: Admitting: Obstetrics and Gynecology

## 2024-06-05 ENCOUNTER — Other Ambulatory Visit (HOSPITAL_COMMUNITY)
Admission: RE | Admit: 2024-06-05 | Discharge: 2024-06-05 | Disposition: A | Discharge status: Home / Self Care | Source: Ambulatory Visit | Attending: Obstetrics and Gynecology | Admitting: Obstetrics and Gynecology

## 2024-06-05 VITALS — BP 114/70 | HR 76 | Wt 153.0 lb

## 2024-06-05 DIAGNOSIS — R87618 Other abnormal cytological findings on specimens from cervix uteri: Secondary | ICD-10-CM

## 2024-06-05 DIAGNOSIS — O09212 Supervision of pregnancy with history of pre-term labor, second trimester: Secondary | ICD-10-CM

## 2024-06-05 DIAGNOSIS — Z98891 History of uterine scar from previous surgery: Secondary | ICD-10-CM

## 2024-06-05 DIAGNOSIS — Z3A16 16 weeks gestation of pregnancy: Secondary | ICD-10-CM | POA: Diagnosis not present

## 2024-06-05 DIAGNOSIS — O0991 Supervision of high risk pregnancy, unspecified, first trimester: Secondary | ICD-10-CM | POA: Diagnosis not present

## 2024-06-05 DIAGNOSIS — O282 Abnormal cytological finding on antenatal screening of mother: Secondary | ICD-10-CM | POA: Diagnosis not present

## 2024-06-05 DIAGNOSIS — Z8759 Personal history of other complications of pregnancy, childbirth and the puerperium: Secondary | ICD-10-CM

## 2024-06-05 DIAGNOSIS — O34219 Maternal care for unspecified type scar from previous cesarean delivery: Secondary | ICD-10-CM

## 2024-06-05 NOTE — Progress Notes (Signed)
    GYNECOLOGY OFFICE COLPOSCOPY PROCEDURE NOTE  28 y.o. G2P0101 at [redacted]w[redacted]d here for colposcopy for history of HSIL pap 2022 (lost to follow up) and NILM/HPV+ pap on 05/08/24  Pregnancy test:  n/a Gardasil:  Not discussed (pregnant)  Informed consent and review of risks, benefit and alternatives performed. Written consent given.   Speculum inserted into patient's vagina assuring full view of cervix and vaginal walls. 3 swabs of vinegar solution applied to the cervix and vaginal walls and colposcope was used to observe both the cervix and vaginal walls.   Colposcopy adequate? Yes Thick AWE at 3-5 o'clock with geographic borders and branching vessels. NO atypical vessels appreciated. Predict CIN II-III. Biopsy taken. Additional thin AWE at 10-12 o'clock with geographic borders. Predict low grade, biopsy deferred given pregnancy.   All specimens were labeled and sent to pathology.  Good hemostasis noted and speculum removed.  Pt tolerated well with minimal pain and bleeding.   Patient was given post procedure instructions.  Will follow up pathology and manage accordingly; patient will be contacted with results and recommendations.  Routine preventative health maintenance measures emphasized.  Kieth Carolin, MD Obstetrician & Gynecologist, [facility] for [employer], [facility]

## 2024-06-05 NOTE — Patient Instructions (Signed)
 It was nice meeting you today! You will see your results in the MyChart app within 1 week.   It is normal to have cramping and bleeding for the next 2-3 days. You should feel better every day.   Please call us  if you have any severe pain, bleeding that soaks more than 1 pad in a hour, have fevers, or feel like you're going to pass out.   You can take tylenol  1000mg  every 6-8 hours as needed for pain.

## 2024-06-05 NOTE — Progress Notes (Signed)
   PRENATAL VISIT NOTE  Subjective:  Emily Poole is a 28 y.o. G2P0101 at [redacted]w[redacted]d being seen today for ongoing prenatal care.  She is currently monitored for the following issues for this high-risk pregnancy and has Anogenital (venereal) warts; Family history of breast cancer in first degree relative; Alcohol  abuse; Encounter for supervision of high risk pregnancy; History of IUGR (intrauterine growth retardation) and stillbirth, currently pregnant, unspecified trimester; History of cesarean delivery; and Abnormal Pap smear of cervix on their problem list.  Patient reports no complaints.  Contractions: Not present. Vag. Bleeding: None.  Movement: Present. Denies leaking of fluid.   The following portions of the patient's history were reviewed and updated as appropriate: allergies, current medications, past family history, past medical history, past social history, past surgical history and problem list.   Objective:   Vitals:   06/05/24 1046  BP: 114/70  Pulse: 76  Weight: 153 lb (69.4 kg)    Fetal Status: Fetal Heart Rate (bpm): 151   Movement: Present     General:  Alert, oriented and cooperative. Patient is in no acute distress.  Skin: Skin is warm and dry. No rash noted.   Cardiovascular: Normal heart rate noted  Respiratory: Normal respiratory effort, no problems with respiration noted  Abdomen: Soft, gravid, appropriate for gestational age.  Pain/Pressure: Absent      Assessment and Plan:  Pregnancy: G2P0101 at [redacted]w[redacted]d 1. [redacted] weeks gestation of pregnancy (Primary) 2. Encounter for supervision of high risk pregnancy Anatomy scheduled 7/31 Discussed AFP - would prefer to do it next visit due to time constraints  3. History of cesarean delivery G1 28wk NRFHT (FGR rEDF)  4. Other abnormal cytological finding of specimen from cervix Colpo today, c/f high grade lesion, 1 cervical biopsy done. - Surgical pathology  5. History of FGR Serial growth US , ldASA  Please refer to  After Visit Summary for other counseling recommendations.   Return in about 4 weeks (around 07/03/2024) for return OB at 20 weeks with AFP.  Future Appointments  Date Time Provider Department Center  06/26/2024 10:00 AM [facility] PROVIDER 1 [facility]  06/26/2024 10:30 AM [facility] US4 [facility]  07/03/2024 10:30 AM Cleatus Moccasin, MD [facility]   Kieth JAYSON Carolin, MD

## 2024-06-09 LAB — SURGICAL PATHOLOGY

## 2024-06-10 ENCOUNTER — Ambulatory Visit: Payer: Self-pay | Admitting: Obstetrics and Gynecology

## 2024-06-16 DIAGNOSIS — O09899 Supervision of other high risk pregnancies, unspecified trimester: Secondary | ICD-10-CM | POA: Insufficient documentation

## 2024-06-16 DIAGNOSIS — F1290 Cannabis use, unspecified, uncomplicated: Secondary | ICD-10-CM | POA: Insufficient documentation

## 2024-06-26 ENCOUNTER — Telehealth: Payer: Self-pay | Admitting: Obstetrics & Gynecology

## 2024-06-26 ENCOUNTER — Ambulatory Visit: Attending: Obstetrics and Gynecology

## 2024-06-26 ENCOUNTER — Ambulatory Visit: Admitting: Obstetrics and Gynecology

## 2024-06-26 ENCOUNTER — Other Ambulatory Visit: Payer: Self-pay | Admitting: Obstetrics and Gynecology

## 2024-06-26 VITALS — BP 127/65 | HR 65

## 2024-06-26 DIAGNOSIS — O021 Missed abortion: Secondary | ICD-10-CM

## 2024-06-26 DIAGNOSIS — Z3A19 19 weeks gestation of pregnancy: Secondary | ICD-10-CM

## 2024-06-26 DIAGNOSIS — O09899 Supervision of other high risk pregnancies, unspecified trimester: Secondary | ICD-10-CM

## 2024-06-26 DIAGNOSIS — O0991 Supervision of high risk pregnancy, unspecified, first trimester: Secondary | ICD-10-CM

## 2024-06-26 DIAGNOSIS — Z3A12 12 weeks gestation of pregnancy: Secondary | ICD-10-CM

## 2024-06-26 DIAGNOSIS — F1290 Cannabis use, unspecified, uncomplicated: Secondary | ICD-10-CM

## 2024-06-26 DIAGNOSIS — O99320 Drug use complicating pregnancy, unspecified trimester: Secondary | ICD-10-CM | POA: Diagnosis present

## 2024-06-26 DIAGNOSIS — O09299 Supervision of pregnancy with other poor reproductive or obstetric history, unspecified trimester: Secondary | ICD-10-CM | POA: Diagnosis present

## 2024-06-26 DIAGNOSIS — Z98891 History of uterine scar from previous surgery: Secondary | ICD-10-CM

## 2024-06-26 DIAGNOSIS — R87619 Unspecified abnormal cytological findings in specimens from cervix uteri: Secondary | ICD-10-CM

## 2024-06-26 DIAGNOSIS — F1010 Alcohol abuse, uncomplicated: Secondary | ICD-10-CM

## 2024-06-26 NOTE — Progress Notes (Signed)
 Maternal-Fetal Medicine Consultation Name: Emily Poole MRN: [id number]  G2 P0101 at 19-week's gestation. Patient is here for fetal anatomy scan.  She was accompanied by her sister.  Her pregnancy is well dated by 6-week ultrasound. On cell-free fetal DNA screening, the risks of fetal aneuploidies are not increased.  Obstetric history significant for a preterm cesarean delivery in 2016 of a female infant weighing 1 pounds and 12 ounces at birth.  Her pregnancy was complicated by severe fetal growth restriction with reversed end-diastolic flow.  Patient reports no chronic medical conditions.  Her blood pressure today at our office is 127/65 mmHg.  Centile is anterior and there is no evidence of previa or placenta accreta spectrum  Ultrasound Amniotic fluid is decreased.  Unfortunately, no fetal heart activity was seen.  Fetal biometry is consistent with [redacted] weeks gestation.  Scalp edema is seen.  No evidence of obvious fetal structural defects.  Fetal demise I explained the finding of fetal demise with the help of ultrasound images.  I briefly explained the possible causes including chromosomal anomalies are genetic syndromes.  I discussed the options of surgical evacuation (D and E) or misoprostol induction.  D&E requires a provider with experience. I did not recommend amniocentesis today because of low amniotic fluid. Fetal sample should be sent for microarray KATRINKA). Patient is very upset and would like to go home to discuss with her partner.  I informed her that she will be getting a call from her provider on the procedure. I discussed with Dr. Jayne who will be scheduling the procedure.  Recommendations -Fetal sample for microarray analysis KATRINKA).     Consultation including face-to-face (more than 50%) counseling 30 minutes.

## 2024-06-26 NOTE — Telephone Encounter (Signed)
 Called to check on patient and left message.

## 2024-06-27 ENCOUNTER — Other Ambulatory Visit: Payer: Self-pay | Admitting: Obstetrics & Gynecology

## 2024-06-27 ENCOUNTER — Inpatient Hospital Stay (HOSPITAL_COMMUNITY)
Admission: AD | Admit: 2024-06-27 | Discharge: 2024-06-29 | DRG: 770 | Disposition: A | Discharge status: Home / Self Care | Attending: Obstetrics and Gynecology | Admitting: Obstetrics and Gynecology

## 2024-06-27 ENCOUNTER — Ambulatory Visit (INDEPENDENT_AMBULATORY_CARE_PROVIDER_SITE_OTHER): Admitting: Certified Nurse Midwife

## 2024-06-27 ENCOUNTER — Other Ambulatory Visit: Payer: Self-pay

## 2024-06-27 ENCOUNTER — Encounter (HOSPITAL_COMMUNITY): Payer: Self-pay | Admitting: Obstetrics & Gynecology

## 2024-06-27 VITALS — BP 125/82 | HR 94 | Wt 162.0 lb

## 2024-06-27 DIAGNOSIS — O0991 Supervision of high risk pregnancy, unspecified, first trimester: Secondary | ICD-10-CM

## 2024-06-27 DIAGNOSIS — O021 Missed abortion: Secondary | ICD-10-CM

## 2024-06-27 DIAGNOSIS — D069 Carcinoma in situ of cervix, unspecified: Secondary | ICD-10-CM | POA: Diagnosis present

## 2024-06-27 DIAGNOSIS — Z01818 Encounter for other preprocedural examination: Secondary | ICD-10-CM

## 2024-06-27 DIAGNOSIS — Z8759 Personal history of other complications of pregnancy, childbirth and the puerperium: Secondary | ICD-10-CM

## 2024-06-27 DIAGNOSIS — Z98891 History of uterine scar from previous surgery: Secondary | ICD-10-CM

## 2024-06-27 DIAGNOSIS — R87618 Other abnormal cytological findings on specimens from cervix uteri: Secondary | ICD-10-CM

## 2024-06-27 DIAGNOSIS — Z3A19 19 weeks gestation of pregnancy: Secondary | ICD-10-CM

## 2024-06-27 LAB — CBC WITH DIFFERENTIAL/PLATELET
Abs Immature Granulocytes: 0.02 K/uL (ref 0.00–0.07)
Basophils Absolute: 0 K/uL (ref 0.0–0.1)
Basophils Relative: 0 %
Eosinophils Absolute: 0.1 K/uL (ref 0.0–0.5)
Eosinophils Relative: 1 %
HCT: 32.1 % — ABNORMAL LOW (ref 36.0–46.0)
Hemoglobin: 11.1 g/dL — ABNORMAL LOW (ref 12.0–15.0)
Immature Granulocytes: 0 %
Lymphocytes Relative: 26 %
Lymphs Abs: 1.8 K/uL (ref 0.7–4.0)
MCH: 30.4 pg (ref 26.0–34.0)
MCHC: 34.6 g/dL (ref 30.0–36.0)
MCV: 87.9 fL (ref 80.0–100.0)
Monocytes Absolute: 0.5 K/uL (ref 0.1–1.0)
Monocytes Relative: 7 %
Neutro Abs: 4.5 K/uL (ref 1.7–7.7)
Neutrophils Relative %: 66 %
Platelets: 241 K/uL (ref 150–400)
RBC: 3.65 MIL/uL — ABNORMAL LOW (ref 3.87–5.11)
RDW: 12.1 % (ref 11.5–15.5)
WBC: 6.9 K/uL (ref 4.0–10.5)
nRBC: 0 % (ref 0.0–0.2)

## 2024-06-27 LAB — TYPE AND SCREEN
ABO/RH(D): AB POS
Antibody Screen: NEGATIVE

## 2024-06-27 MED ORDER — KETOROLAC TROMETHAMINE 30 MG/ML IJ SOLN
30.0000 mg | Freq: Three times a day (TID) | INTRAMUSCULAR | Status: AC | PRN
Start: 2024-06-27 — End: 2024-06-29

## 2024-06-27 MED ORDER — KCL-LACTATED RINGERS-D5W 20 MEQ/L IV SOLN: INTRAVENOUS | Status: AC

## 2024-06-27 MED ORDER — MISOPROSTOL 200 MCG PO TABS: 800.0000 ug | ORAL_TABLET | ORAL | Status: AC

## 2024-06-27 MED ORDER — PRENATAL MULTIVITAMIN CH: 1.0000 | ORAL_TABLET | Freq: Every day | ORAL | Status: DC

## 2024-06-27 MED ORDER — KCL-LACTATED RINGERS-D5W 20 MEQ/L IV SOLN
INTRAVENOUS | Status: DC
  Filled 2024-06-27 (×3): qty 1000

## 2024-06-27 MED ORDER — ONDANSETRON HCL 4 MG PO TABS: 8.0000 mg | ORAL_TABLET | Freq: Four times a day (QID) | ORAL | Status: DC | PRN

## 2024-06-27 MED ORDER — PRENATAL MULTIVITAMIN CH: 1.0000 | ORAL_TABLET | Freq: Every day | ORAL | Status: AC

## 2024-06-27 MED ORDER — MISOPROSTOL 200 MCG PO TABS
800.0000 ug | ORAL_TABLET | ORAL | Status: DC
  Administered 2024-06-27 – 2024-06-28 (×4): 800 ug via BUCCAL
  Filled 2024-06-27 (×5): qty 4

## 2024-06-27 MED ORDER — FENTANYL CITRATE PF 50 MCG/ML IJ SOSY: 50.0000 ug | PREFILLED_SYRINGE | INTRAMUSCULAR | Status: AC | PRN

## 2024-06-27 MED ORDER — SODIUM CHLORIDE 0.9 % IV SOLN: 8.0000 mg | Freq: Four times a day (QID) | INTRAVENOUS | Status: DC | PRN

## 2024-06-27 MED ORDER — KETOROLAC TROMETHAMINE 30 MG/ML IJ SOLN: 30.0000 mg | Freq: Four times a day (QID) | INTRAMUSCULAR | Status: DC | PRN

## 2024-06-27 MED ORDER — FENTANYL CITRATE (PF) 100 MCG/2ML IJ SOLN: 25.0000 ug | INTRAMUSCULAR | Status: DC | PRN

## 2024-06-27 MED ORDER — OXYCODONE-ACETAMINOPHEN 5-325 MG PO TABS: 1.0000 | ORAL_TABLET | ORAL | Status: DC | PRN

## 2024-06-27 MED ORDER — KETOROLAC TROMETHAMINE 30 MG/ML IJ SOLN
30.0000 mg | Freq: Three times a day (TID) | INTRAMUSCULAR | Status: DC | PRN
  Administered 2024-06-27 – 2024-06-28 (×2): 30 mg via INTRAVENOUS
  Filled 2024-06-27 (×2): qty 1

## 2024-06-27 NOTE — Patient Instructions (Signed)
 Emily Poole

## 2024-06-27 NOTE — Progress Notes (Signed)
 History:  Ms. Emily Poole is a 28 y.o. G2P0101 who presents to clinic today following diagnosis of IUFD, for further counseling.   Denies pain, bleeding, fever, chills or other concern.    The following portions of the patient's history were reviewed and updated as appropriate: allergies, current medications, family history, past medical history, social history, past surgical history and problem list.  Review of Systems:  Review of Systems  Constitutional:  Negative for chills, fever and weight loss.  Respiratory:  Negative for cough, hemoptysis, shortness of breath and wheezing.   Cardiovascular:  Negative for chest pain and palpitations.  Gastrointestinal:  Negative for abdominal pain, diarrhea, heartburn, nausea and vomiting.  Genitourinary:  Negative for dysuria, frequency and urgency.      Objective:  Physical Exam BP 125/82   Pulse 94   Wt 162 lb (73.5 kg)   LMP 08/10/2023 (Approximate)   BMI 28.25 kg/m  Physical Exam Constitutional:      General: She is not in acute distress.    Appearance: Normal appearance. She is normal weight. She is not ill-appearing.  HENT:     Head: Normocephalic.  Cardiovascular:     Rate and Rhythm: Normal rate and regular rhythm.     Pulses: Normal pulses.  Pulmonary:     Effort: Pulmonary effort is normal.  Musculoskeletal:        General: Normal range of motion.  Skin:    General: Skin is warm and dry.     Capillary Refill: Capillary refill takes less than 2 seconds.  Neurological:     Mental Status: She is alert and oriented to person, place, and time.  Psychiatric:        Mood and Affect: Mood normal.        Behavior: Behavior normal.        Thought Content: Thought content normal.        Judgment: Judgment normal.      Labs and Imaging No results found for this or any previous visit (from the past 24 hours).  No results found.   Assessment & Plan:  1. IUFD at less than 20 weeks of gestation (Primary) - Diagnosed with  IUFD at [facility] 06/26/24. Was counseled by Dr. Jayne on options over the phone and was given time to consider. Still unsure today, having trouble processing. Reviewed options for pain control and management of IUFD. Called Dr. Jayne, who asked to speak with patient via speaker phone. Decision made to proceed to [facility] for IOL, as patient does not desire epidural.  - Patient to present to OBSCU at 1300 today.   2. History of cesarean delivery - Ok for IOL at this gestation.    Regino Camie LABOR, CNM 06/27/2024 3:42 PM

## 2024-06-27 NOTE — Progress Notes (Signed)
 States went to imaging yesterday. States did not find heartbeat. Here for follow up. Denies cramping or bleeding at this time.

## 2024-06-27 NOTE — H&P (Signed)
 Preoperative History and Physical  Emily Poole is a 28 y.o. G2P0101 with Patient's last menstrual period was 08/10/2023 (approximate). admitted for a medical management of 15 week size loss by sonogram 06/26/24.  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    PSH:     Past Surgical History:  Procedure Laterality Date   CESAREAN SECTION N/A 12/25/2014   Procedure: CESAREAN SECTION;  Surgeon: Burnard VEAR Pate, MD;  Location: [facility];  Service: Obstetrics;  Laterality: N/A;   WISDOM TOOTH EXTRACTION      POb/GynH:      OB History     Gravida  2   Para  1   Term      Preterm  1   AB      Living  1      SAB      IAB      Ectopic      Multiple  0   Live Births  1           SH:   Social History   Tobacco Use   Smoking status: Never   Smokeless tobacco: Never  Vaping Use   Vaping status: Never Used  Substance Use Topics   Alcohol  use: Yes    Comment: occ   Drug use: Yes    Frequency: 7.0 times per week    Types: Marijuana    Comment: mornings to help with nausea    FH:    Family History  Problem Relation Age of Onset   Diabetes Father    Hypertension Mother      Allergies: No Known Allergies  Medications:       Current Facility-Administered Medications:    dextrose  5% in lactated ringers  with KCl 20 mEq/L infusion, , Intravenous, Continuous, Courtnay Petrilla, Vonn VEAR, MD   fentaNYL  (SUBLIMAZE ) injection 25-100 mcg, 25-100 mcg, Intravenous, Q2H PRN, Jayne, Vonn VEAR, MD   ketorolac  (TORADOL ) 30 MG/ML injection 30 mg, 30 mg, Intravenous, Q8H PRN, Jayne, Vonn VEAR, MD   misoprostol (CYTOTEC) tablet 800 mcg, 800 mcg, Buccal, Q4H, Hasna Stefanik, Vonn VEAR, MD   ondansetron  (ZOFRAN ) tablet 8 mg, 8 mg, Oral, Q6H PRN **OR** ondansetron  (ZOFRAN ) 8 mg in sodium chloride  0.9 % 50 mL IVPB, 8 mg, Intravenous, Q6H PRN, Jayne, Vonn VEAR, MD   oxyCODONE -acetaminophen   (PERCOCET/ROXICET) 5-325 MG per tablet 1-2 tablet, 1-2 tablet, Oral, Q3H PRN, Jayne Vonn VEAR, MD   [START ON 06/28/2024] prenatal multivitamin tablet 1 tablet, 1 tablet, Oral, Q1200, Taunia Frasco, Vonn VEAR, MD  Facility-Administered Medications Ordered in Other Encounters:    dextrose  5% in lactated ringers  with KCl 20 mEq/L infusion, , Intravenous, Continuous, Tamira Ryland, Vonn VEAR, MD   fentaNYL  (SUBLIMAZE ) injection 50-100 mcg, 50-100 mcg, Intravenous, Q2H PRN, Jayne Vonn VEAR, MD   ketorolac  (TORADOL ) 30 MG/ML injection 30 mg, 30 mg, Intravenous, Q8H PRN, Jayne Vonn VEAR, MD   misoprostol (CYTOTEC) tablet 800 mcg, 800 mcg, Buccal, Q4H, Luvern Mischke, Vonn VEAR, MD   prenatal multivitamin tablet 1 tablet, 1 tablet, Oral, Q1200, Jayne Vonn VEAR, MD  Review of Systems:   Review of Systems  Constitutional: Negative for fever, chills, weight loss, malaise/fatigue and diaphoresis.  HENT: Negative for hearing loss, ear pain, nosebleeds, congestion, sore throat, neck pain, tinnitus and ear discharge.   Eyes: Negative for blurred vision, double vision, photophobia, pain, discharge and redness.  Respiratory: Negative for cough, hemoptysis, sputum production, shortness of breath, wheezing and stridor.   Cardiovascular: Negative for chest pain, palpitations, orthopnea, claudication, leg swelling and PND.  Gastrointestinal: Positive for abdominal pain. Negative for heartburn, nausea, vomiting, diarrhea, constipation, blood in stool and melena.  Genitourinary: Negative for dysuria, urgency, frequency, hematuria and flank pain.  Musculoskeletal: Negative for myalgias, back pain, joint pain and falls.  Skin: Negative for itching and rash.  Neurological: Negative for dizziness, tingling, tremors, sensory change, speech change, focal weakness, seizures, loss of consciousness, weakness and headaches.  Endo/Heme/Allergies: Negative for environmental allergies and polydipsia. Does not bruise/bleed easily.  Psychiatric/Behavioral:  Negative for depression, suicidal ideas, hallucinations, memory loss and substance abuse. The patient is not nervous/anxious and does not have insomnia.      PHYSICAL EXAM:  Blood pressure 123/62, pulse 71, temperature 98.4 F (36.9 C), temperature source Oral, resp. rate 17, last menstrual period 08/10/2023, SpO2 100%.    Vitals reviewed. Constitutional: She is oriented to person, place, and time. She appears well-developed and well-nourished.  HENT:  Head: Normocephalic and atraumatic.  Right Ear: External ear normal.  Left Ear: External ear normal.  Nose: Nose normal.  Mouth/Throat: Oropharynx is clear and moist.  Eyes: Conjunctivae and EOM are normal. Pupils are equal, round, and reactive to light. Right eye exhibits no discharge. Left eye exhibits no discharge. No scleral icterus.  Neck: Normal range of motion. Neck supple. No tracheal deviation present. No thyromegaly present.  Cardiovascular: Normal rate, regular rhythm, normal heart sounds and intact distal pulses.  Exam reveals no gallop and no friction rub.   No murmur heard. Respiratory: Effort normal and breath sounds normal. No respiratory distress. She has no wheezes. She has no rales. She exhibits no tenderness.  GI: Soft. Bowel sounds are normal. She exhibits no distension and no mass. There is tenderness. There is no rebound and no guarding.  Genitourinary:       Vulva is normal without lesions Vagina is pink moist without discharge Cervix normal in appearance and pap is normal Uterus is per sonogram Adnexa is negative with normal sized ovaries by sonogram  Musculoskeletal: Normal range of motion. She exhibits no edema and no tenderness.  Neurological: She is alert and oriented to person, place, and time. She has normal reflexes. She displays normal reflexes. No cranial nerve deficit. She exhibits normal muscle tone. Coordination normal.  Skin: Skin is warm and dry. No rash noted. No erythema. No pallor.   Psychiatric: She has a normal mood and affect. Her behavior is normal. Judgment and thought content normal.    Labs: No results found for this or any previous visit (from the past 2 weeks).  EKG: Orders placed or performed during the hospital encounter of 10/21/18   ED EKG   ED EKG   EKG 12-Lead   EKG 12-Lead   EKG    Imaging Studies: US  MFM OB LIMITED Result Date: 06/26/2024 ----------------------------------------------------------------------  OBSTETRICS REPORT                    (Corrected Final 06/26/2024 11:57 am) ---------------------------------------------------------------------- Patient Info  ID #:       [id number]                          D.O.B.:  08-12-1996 (28 yrs)(F)  Name:       YOLANDE GORMAN RAKE  Visit Date: 06/26/2024 10:51 am ---------------------------------------------------------------------- Performed By  Attending:        Fredia Fresh MD        Ref. Address:     [address], [place]  Performed By:     Elenor Edu BS      [facility]  Referred By:      RANEE Lofts ---------------------------------------------------------------------- Orders  #  Description                           Code        Ordered By  1  US  MFM OB LIMITED                     23184.98    VINA SOLIAN ----------------------------------------------------------------------  #  Order #                     Accession #                Episode #  1  [id number]                   [id number]                 [id number] ---------------------------------------------------------------------- Indications  [redacted] weeks gestation of pregnancy                Z3A.19  Fetal demise/Missed AB less than 22  weeks      O02.1  History of cesarean delivery, currently        O34.219  pregnant  Drug use complicating pregnancy, 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)  Poor obstetric history: Previous fetal growth  O09.299  restriction (FGR)  Poor obstetric history: Previous preterm       O09.219  delivery, antepartum (28w5) ---------------------------------------------------------------------- Fetal Evaluation  Num Of Fetuses:         1  Cardiac Activity:  Absent  Presentation:           Transverse, head to maternal left  Placenta:               Anterior  Amniotic Fluid  AFI FV:      Subjectively decreased                              Largest Pocket(cm)                              2.26 ---------------------------------------------------------------------- Biometry  BPD:      27.3  mm     G. Age:  14w 6d        < 1  %    CI:         59.5   %    70 - 86                                                          FL/HC:      13.2   %    16.1 - 18.3  HC:      114.9  mm     G. Age:  15w 4d        < 1  %    HC/AC:      1.34        1.09 - 1.39  AC:       85.7  mm     G. Age:  14w 6d        < 1  %    FL/BPD:     55.7   %  FL:       15.2  mm     G. Age:  14w 3d        < 1  %    FL/AC:      17.7   %    20 - 24  Est. FW:     106  gm      0 lb 4 oz    < 1  % ---------------------------------------------------------------------- OB History  Gravidity:    2         Prem:   1  Living:       1 ---------------------------------------------------------------------- Gestational Age  U/S Today:     15w 0d                                        EDD:   12/18/24  Best:          19w 0d     Det. By:  Early Ultrasound         EDD:   11/20/24                                      (04/01/24) ---------------------------------------------------------------------- Cervix Uterus Adnexa  Cervix  Length:            3.5  cm.  Normal appearance by transabdominal scan  Uterus  No abnormality visualized.  Right Ovary  Not visualized.  Left Ovary  Not  visualized.  Cul De Sac  No free fluid seen.  Adnexa  No abnormality visualized ---------------------------------------------------------------------- Impression  G2 P0101 at 19-week's gestation. Patient is here for fetal  anatomy scan.  She was accompanied by her sister.  Her  pregnancy is well dated by 6-week ultrasound.  On cell-free fetal DNA screening, the risks of fetal  aneuploidies are not increased.  Obstetric history significant for a preterm cesarean delivery in  2016 of a female infant weighing 1 pounds and 12 ounces at  birth.  Her pregnancy was complicated by severe fetal growth  restriction with reversed end-diastolic flow.  Patient reports no chronic medical conditions.  Her blood  pressure today at our office is 127/65 mmHg.  Centile is  anterior and there is no evidence of previa or placenta  accreta spectrum  Ultrasound  Amniotic fluid is decreased.  Unfortunately, no fetal heart  activity was seen.  Fetal biometry is consistent with [redacted] weeks  gestation.  Scalp edema is seen.  No evidence of obvious  fetal structural defects.  Fetal demise  I explained the finding of fetal demise with the help of  ultrasound images.  I briefly explained the possible causes  including chromosomal anomalies are genetic syndromes.  I  discussed the options of surgical evacuation (D and E) or  misoprostol induction.  D&E requires a provider with  experience.  I did not recommend amniocentesis today because of low  amniotic fluid. Fetal sample should be sent for microarray  KATRINKA).  Patient is very upset and would like to go home to discuss  with her partner.  I informed her that she will be getting a call  from her provider on the procedure.  I discussed with Dr. Jayne who will be scheduling the  procedure. ---------------------------------------------------------------------- Recommendations  -Fetal sample for microarray analysis KATRINKA) ----------------------------------------------------------------------                       Fredia Fresh, MD Electronically Signed Corrected Final Report  06/26/2024 11:57 am ----------------------------------------------------------------------      Assessment: Fetal loss, prior to 20 weeks 15 weeks size, 19 weeks by early dating  Plan: Cytotec for therapy, 800  mcg every 4h til delivery Prn meds ordered  Vonn VEAR Jayne 06/27/2024 2:35 PM

## 2024-06-28 ENCOUNTER — Observation Stay (HOSPITAL_COMMUNITY): Admitting: Anesthesiology

## 2024-06-28 ENCOUNTER — Encounter (HOSPITAL_COMMUNITY): Payer: Self-pay | Admitting: Obstetrics & Gynecology

## 2024-06-28 ENCOUNTER — Encounter (HOSPITAL_COMMUNITY)
Admission: AD | Disposition: A | Discharge status: Home / Self Care | Payer: Self-pay | Source: Home / Self Care | Attending: Obstetrics & Gynecology

## 2024-06-28 ENCOUNTER — Other Ambulatory Visit: Payer: Self-pay

## 2024-06-28 ENCOUNTER — Observation Stay (HOSPITAL_COMMUNITY)

## 2024-06-28 DIAGNOSIS — Z3A19 19 weeks gestation of pregnancy: Secondary | ICD-10-CM | POA: Diagnosis not present

## 2024-06-28 DIAGNOSIS — O021 Missed abortion: Secondary | ICD-10-CM | POA: Diagnosis present

## 2024-06-28 DIAGNOSIS — D069 Carcinoma in situ of cervix, unspecified: Secondary | ICD-10-CM | POA: Diagnosis present

## 2024-06-28 DIAGNOSIS — Z3A00 Weeks of gestation of pregnancy not specified: Secondary | ICD-10-CM

## 2024-06-28 HISTORY — PX: DILATION AND CURETTAGE OF UTERUS: SHX78

## 2024-06-28 SURGERY — DILATION AND CURETTAGE
Anesthesia: General | Site: Vagina

## 2024-06-28 MED ORDER — IBUPROFEN 600 MG PO TABS
600.0000 mg | ORAL_TABLET | Freq: Four times a day (QID) | ORAL | Status: DC
  Administered 2024-06-28: 600 mg via ORAL
  Filled 2024-06-28 (×2): qty 1

## 2024-06-28 MED ORDER — METOCLOPRAMIDE HCL 5 MG/ML IJ SOLN
INTRAMUSCULAR | Status: AC
  Filled 2024-06-28: qty 2

## 2024-06-28 MED ORDER — OXYTOCIN-SODIUM CHLORIDE 30-0.9 UT/500ML-% IV SOLN
INTRAVENOUS | Status: DC
Start: 2024-06-28 — End: 2024-06-28
  Filled 2024-06-28: qty 500

## 2024-06-28 MED ORDER — OXYCODONE HCL 5 MG PO TABS: 5.0000 mg | ORAL_TABLET | Freq: Once | ORAL | Status: DC | PRN

## 2024-06-28 MED ORDER — LIDOCAINE HCL 1 % IJ SOLN
INTRAMUSCULAR | Status: DC | PRN
  Administered 2024-06-28: 20 mL

## 2024-06-28 MED ORDER — MIDAZOLAM HCL 2 MG/2ML IJ SOLN
INTRAMUSCULAR | Status: DC | PRN
  Administered 2024-06-28: 2 mg via INTRAVENOUS

## 2024-06-28 MED ORDER — OXYCODONE-ACETAMINOPHEN 5-325 MG PO TABS: 1.0000 | ORAL_TABLET | Freq: Three times a day (TID) | ORAL | Status: DC | PRN

## 2024-06-28 MED ORDER — LIDOCAINE HCL 1 % IJ SOLN
INTRAMUSCULAR | Status: AC
  Filled 2024-06-28: qty 20

## 2024-06-28 MED ORDER — ONDANSETRON HCL 4 MG/2ML IJ SOLN
INTRAMUSCULAR | Status: DC | PRN
  Administered 2024-06-28: 4 mg via INTRAVENOUS

## 2024-06-28 MED ORDER — AMISULPRIDE (ANTIEMETIC) 5 MG/2ML IV SOLN: 10.0000 mg | Freq: Once | INTRAVENOUS | Status: DC | PRN

## 2024-06-28 MED ORDER — MISOPROSTOL 200 MCG PO TABS: 800.0000 ug | ORAL_TABLET | ORAL | Status: DC

## 2024-06-28 MED ORDER — SODIUM CHLORIDE 0.9 % IV SOLN: 2.5000 [IU]/h | INTRAVENOUS | Status: DC

## 2024-06-28 MED ORDER — OXYTOCIN-SODIUM CHLORIDE 30-0.9 UT/500ML-% IV SOLN
INTRAVENOUS | Status: AC
  Filled 2024-06-28: qty 500

## 2024-06-28 MED ORDER — LACTATED RINGERS IV SOLN: INTRAVENOUS | Status: DC | PRN

## 2024-06-28 MED ORDER — ONDANSETRON HCL 4 MG/2ML IJ SOLN
INTRAMUSCULAR | Status: AC
  Filled 2024-06-28: qty 2

## 2024-06-28 MED ORDER — GLYCOPYRROLATE PF 0.2 MG/ML IJ SOSY
PREFILLED_SYRINGE | INTRAMUSCULAR | Status: AC
  Filled 2024-06-28: qty 1

## 2024-06-28 MED ORDER — LACTATED RINGERS IV SOLN: INTRAVENOUS | Status: DC

## 2024-06-28 MED ORDER — DEXAMETHASONE SODIUM PHOSPHATE 10 MG/ML IJ SOLN
INTRAMUSCULAR | Status: AC
  Filled 2024-06-28: qty 1

## 2024-06-28 MED ORDER — PROPOFOL 500 MG/50ML IV EMUL
INTRAVENOUS | Status: DC | PRN
Start: 2024-06-28 — End: 2024-06-28
  Administered 2024-06-28: 100 ug/kg/min via INTRAVENOUS

## 2024-06-28 MED ORDER — EPHEDRINE 5 MG/ML INJ
INTRAVENOUS | Status: AC
  Filled 2024-06-28: qty 5

## 2024-06-28 MED ORDER — MISOPROSTOL 200 MCG PO TABS
800.0000 ug | ORAL_TABLET | Freq: Once | ORAL | Status: AC
  Administered 2024-06-28: 800 ug via BUCCAL
  Filled 2024-06-28: qty 4

## 2024-06-28 MED ORDER — TRANEXAMIC ACID-NACL 1000-0.7 MG/100ML-% IV SOLN
1000.0000 mg | Freq: Once | INTRAVENOUS | Status: AC
  Administered 2024-06-28: 1000 mg via INTRAVENOUS

## 2024-06-28 MED ORDER — FENTANYL CITRATE (PF) 100 MCG/2ML IJ SOLN
INTRAMUSCULAR | Status: DC | PRN
  Administered 2024-06-28 (×2): 50 ug via INTRAVENOUS

## 2024-06-28 MED ORDER — STERILE WATER FOR IRRIGATION IR SOLN
Status: DC | PRN
  Administered 2024-06-28: 1000 mL

## 2024-06-28 MED ORDER — DEXMEDETOMIDINE HCL IN NACL 80 MCG/20ML IV SOLN
INTRAVENOUS | Status: AC
  Filled 2024-06-28: qty 20

## 2024-06-28 MED ORDER — FENTANYL CITRATE (PF) 250 MCG/5ML IJ SOLN
INTRAMUSCULAR | Status: AC
  Filled 2024-06-28: qty 5

## 2024-06-28 MED ORDER — FENTANYL CITRATE (PF) 100 MCG/2ML IJ SOLN
100.0000 ug | Freq: Once | INTRAMUSCULAR | Status: AC | PRN
  Administered 2024-06-28: 100 ug via INTRAVENOUS
  Filled 2024-06-28: qty 2

## 2024-06-28 MED ORDER — OXYTOCIN BOLUS FROM INFUSION
333.0000 mL | Freq: Once | INTRAVENOUS | Status: AC
  Administered 2024-06-28: 333 mL via INTRAVENOUS

## 2024-06-28 MED ORDER — MISOPROSTOL 200 MCG PO TABS
800.0000 ug | ORAL_TABLET | Freq: Once | ORAL | Status: AC
  Administered 2024-06-28: 800 ug via RECTAL

## 2024-06-28 MED ORDER — DOXYCYCLINE HYCLATE 100 MG IV SOLR
200.0000 mg | Freq: Once | INTRAVENOUS | Status: AC
  Administered 2024-06-28: 200 mg via INTRAVENOUS
  Filled 2024-06-28: qty 200

## 2024-06-28 MED ORDER — HYDROMORPHONE HCL 1 MG/ML IJ SOLN: 0.2500 mg | INTRAMUSCULAR | Status: DC | PRN

## 2024-06-28 MED ORDER — SODIUM CHLORIDE 0.9 % IV SOLN: 12.5000 mg | INTRAVENOUS | Status: DC | PRN

## 2024-06-28 MED ORDER — PROPOFOL 500 MG/50ML IV EMUL
INTRAVENOUS | Status: AC
  Filled 2024-06-28: qty 50

## 2024-06-28 MED ORDER — ACETAMINOPHEN 325 MG PO TABS
650.0000 mg | ORAL_TABLET | ORAL | Status: DC | PRN
  Administered 2024-06-28: 650 mg via ORAL
  Filled 2024-06-28: qty 2

## 2024-06-28 MED ORDER — PHENYLEPHRINE 80 MCG/ML (10ML) SYRINGE FOR IV PUSH (FOR BLOOD PRESSURE SUPPORT)
PREFILLED_SYRINGE | INTRAVENOUS | Status: AC
  Filled 2024-06-28: qty 10

## 2024-06-28 MED ORDER — DEXAMETHASONE SODIUM PHOSPHATE 10 MG/ML IJ SOLN: INTRAMUSCULAR | Status: DC | PRN

## 2024-06-28 MED ORDER — OXYCODONE HCL 5 MG/5ML PO SOLN: 5.0000 mg | Freq: Once | ORAL | Status: DC | PRN

## 2024-06-28 MED ORDER — MIDAZOLAM HCL 2 MG/2ML IJ SOLN
INTRAMUSCULAR | Status: AC
  Filled 2024-06-28: qty 2

## 2024-06-28 MED ORDER — LIDOCAINE HCL (CARDIAC) PF 100 MG/5ML IV SOSY
PREFILLED_SYRINGE | INTRAVENOUS | Status: DC | PRN
  Administered 2024-06-28: 30 mg via INTRAVENOUS

## 2024-06-28 MED ORDER — DEXMEDETOMIDINE HCL IN NACL 80 MCG/20ML IV SOLN
INTRAVENOUS | Status: DC | PRN
  Administered 2024-06-28 (×4): 10 ug via INTRAVENOUS

## 2024-06-28 MED ORDER — METOCLOPRAMIDE HCL 5 MG/ML IJ SOLN
INTRAMUSCULAR | Status: DC | PRN
  Administered 2024-06-28: 10 mg via INTRAVENOUS

## 2024-06-28 MED ORDER — OXYTOCIN-SODIUM CHLORIDE 30-0.9 UT/500ML-% IV SOLN
41.7000 mL/h | INTRAVENOUS | Status: DC
  Administered 2024-06-28: 41.7 mL/h via INTRAVENOUS

## 2024-06-28 SURGICAL SUPPLY — 13 items
CATH ROBINSON RED A/P 16FR (CATHETERS) ×1 IMPLANT
CONT PATH 16OZ SNAP LID 3702 (MISCELLANEOUS) ×1 IMPLANT
GLOVE BIOGEL PI IND STRL 7.0 (GLOVE) ×1 IMPLANT
GLOVE SURG SS PI 7.0 STRL IVOR (GLOVE) ×1 IMPLANT
GLOVE SURG UNDER POLY LF SZ7.5 (GLOVE) ×1 IMPLANT
GOWN STRL REUS W/ TWL LRG LVL3 (GOWN DISPOSABLE) ×1 IMPLANT
GOWN STRL REUS W/ TWL XL LVL3 (GOWN DISPOSABLE) ×1 IMPLANT
HIBICLENS CHG 4% 4OZ BTL (MISCELLANEOUS) ×1 IMPLANT
NS IRRIG 1000ML POUR BTL (IV SOLUTION) ×1 IMPLANT
PACK VAGINAL MINOR WOMEN LF (CUSTOM PROCEDURE TRAY) ×1 IMPLANT
PAD OB MATERNITY 4.3X12.25 (PERSONAL CARE ITEMS) ×1 IMPLANT
PAD PREP 24X48 CUFFED NSTRL (MISCELLANEOUS) ×1 IMPLANT
TOWEL OR 17X24 6PK STRL BLUE (TOWEL DISPOSABLE) ×2 IMPLANT

## 2024-06-28 NOTE — Progress Notes (Signed)
 OB Note Ultrasound shows 23mm endometrium, thickened and heterogenous, avascular. Patient stable with still no bleeding, pain. U/s images reviewed and agree and likely POCs high at the fundus down to the LUS. Given this, GA of between 15-19wks at time of IUFD and the fact that the POCs were somewhat adherent when I removed what was visible, I don't feel an MVA would be best. I d/w her re: d&c, again and r/b and she is amenable to this. Patient has been NPO all day. OR called and will post  Plan to keep patient overnight.   Bebe Izell Raddle MD Attending [facility] (Faculty Practice) 06/28/2024 Time: [phone]

## 2024-06-28 NOTE — Anesthesia Preprocedure Evaluation (Addendum)
 Anesthesia Evaluation  Patient identified by MRN, date of birth, ID band Patient awake    Reviewed: Allergy & Precautions, H&P , NPO status , Patient's Chart, lab work & pertinent test results  Airway Mallampati: II  TM Distance: >3 FB Neck ROM: Full    Dental no notable dental hx.    Pulmonary neg pulmonary ROS   Pulmonary exam normal breath sounds clear to auscultation       Cardiovascular negative cardio ROS Normal cardiovascular exam Rhythm:Regular Rate:Normal     Neuro/Psych negative neurological ROS  negative psych ROS   GI/Hepatic negative GI ROS, Neg liver ROS,,,  Endo/Other  negative endocrine ROS    Renal/GU negative Renal ROS  negative genitourinary   Musculoskeletal negative musculoskeletal ROS (+)    Abdominal   Peds negative pediatric ROS (+)  Hematology negative hematology ROS (+)   Anesthesia Other Findings   Reproductive/Obstetrics negative OB ROS                              Anesthesia Physical Anesthesia Plan  ASA: 2  Anesthesia Plan: MAC   Post-op Pain Management: Tylenol  PO (pre-op)*   Induction: Intravenous, Rapid sequence and Cricoid pressure planned  PONV Risk Score and Plan: 2 and Propofol  infusion and Treatment may vary due to age or medical condition  Airway Management Planned:   Additional Equipment:   Intra-op Plan:   Post-operative Plan:   Informed Consent: I have reviewed the patients History and Physical, chart, labs and discussed the procedure including the risks, benefits and alternatives for the proposed anesthesia with the patient or authorized representative who has indicated his/her understanding and acceptance.     Dental advisory given  Plan Discussed with: CRNA  Anesthesia Plan Comments:          Anesthesia Quick Evaluation

## 2024-06-28 NOTE — Progress Notes (Signed)
 Patient ID: Emily Poole, female   DOB: 1996-09-16, 28 y.o.   MRN: [id number] Assessed at 1021 Minimal bleeding Cord is quite small and was not attached Cervix minimally open with ledaing edge of placenta just barely through Will give a dose of cytotec  800 micrograms buccal now and every 4 hours Discussed management of when to do OR manual/surgical removal and it will be based on bleeding For now not much bleeding at all  Pt and family understand and agree with the plan  Vonn VEAR Inch, MD 06/28/2024 10:23 AM

## 2024-06-28 NOTE — Progress Notes (Signed)
 OB Note Fentanyl  given and speculum exam done with nurse. Small blood clot in vault with some membranes seen; cervix visually 1cm. With ringed forceps, I was able to remove visible POCs and some higher up not seen but nothing after that. Bleeding minimal and no bleeding after exam.  Bedside u/s done but patient has just voided so difficulty seeing uterus. Will get formal u/s. Pt told if still retained POCs that I recommend d&c.   Bebe Izell Raddle MD Attending [facility] ([facility]ulty Practice) 06/28/2024 Time: [phone]

## 2024-06-28 NOTE — Progress Notes (Signed)
 OB Note I went to check on the patient and she said that she is having minimal bleeding and occasional cramps but no real pain. She delivered spontaneously at 0800 without issue with placenta being retained, which it still is. Postpartum, she's received cytotec  buccal 800 at 0800 and again at 1030.   I did a bedside u/s and the placenta is still in the uterus, extending to the fundus and no e/o bleeding, separation or that it's coming donw; the uterus is avascular on doppler mode.   I told her I recommend she be NPO and that I'll come back in a few hours and if no signs that placenta is coming that we do an exam in the room and see we it can be teased out. I did tell her that if that doesn't work, I'd recommend going to the OR for an EUA and removal of placenta, possible d&c, which she is amenable to.  Bebe Izell Raddle MD [facility]ending [facility] ([facility]ulty [facility]) 06/28/2024 Time: [phone]

## 2024-06-28 NOTE — Op Note (Signed)
 Operative Note   06/28/2024  PRE-OP DIAGNOSIS: retained products of conception after 19wk IUFD and delivery.    POST-OP DIAGNOSIS: Same.   SURGEON: Surgeons and Role:    * Izell Harari, MD - Primary  ASSISTANT: none  PROCEDURE:  Dilation and curettage with ultrasound guidance  ANESTHESIA: MAC and paracervical block  ESTIMATED BLOOD LOSS: 25mL  DRAINS: I/O cath for 50mL UOP  TOTAL IV FLUIDS: per OR note  SPECIMENS: products of conception to pathology  VTE PROPHYLAXIS: SCDs to the bilateral lower extremities  ANTIBIOTICS: Doxycycline  200mg  IV x 1 pre op  COMPLICATIONS: none  DISPOSITION: PACU - hemodynamically stable.  CONDITION: stable  BLOOD TYPE: AB POS. Rhogam given: not applicable  FINDINGS: Normal EGBUS, vagina and cervix with cervix still visually 1cm and no bleeding or POC seen. Moderate amount of Necrotic appearing products of conception were seen, with gritty texture in all four quadrants. Negative endometrial stripe on ultrasound during procedure and with ultrasound guidance  PROCEDURE IN DETAIL:  After informed consent was obtained, the patient was taken to the operating room where anesthesia was obtained without difficulty. The patient was positioned in the dorsal lithotomy position in Triumph stirrups. The patient was examined under anesthesia, with the above noted findings.  The bi-valved speculum was placed inside the patient's vagina, and the the anterior lip of the cervix was seen and grasped with the tenaculum.  A paracervical block was achieved with 20mL of 1% lidocaine  and then the cervix was already dilated enough to fit a large curettage. Curettage done until gritty texture in all four quadrants and no more POCs being removed  Excellent hemostasis was noted, and all instruments were removed, with excellent hemostasis noted throughout.  She was then taken out of dorsal lithotomy. The patient tolerated the procedure well.  Sponge, lap and instrument counts  were correct x2.  The patient was taken to recovery room in excellent condition.  Harari Izell Raddle MD Attending [facility])

## 2024-06-28 NOTE — Anesthesia Postprocedure Evaluation (Signed)
 Anesthesia Post Note  Patient: Emily Poole  Procedure(s) Performed: DILATION AND CURETTAGE (Vagina )     Anesthesia Type: General Anesthetic complications: no   No notable events documented.  Last Vitals:  Vitals:   06/28/24 2015 06/28/24 2027  BP: 115/80 117/76  Pulse: (!) 57 (!) 59  Resp: 13   Temp:  36.5 C  SpO2: 100% 100%    Last Pain:  Vitals:   06/28/24 2036  TempSrc:   PainSc: 0-No pain   Pain Goal:                   Butler Levander Pinal

## 2024-06-28 NOTE — Transfer of Care (Signed)
 Immediate Anesthesia Transfer of Care Note  Patient: Emily Poole  Procedure(s) Performed: DILATION AND CURETTAGE (Vagina )  Patient Location: PACU  Anesthesia Type:MAC  Level of Consciousness: awake, alert , and oriented  Airway & Oxygen Therapy: Patient Spontanous Breathing and Patient connected to face mask oxygen  Post-op Assessment: Report given to RN and Post -op Vital signs reviewed and stable  Post vital signs: Reviewed and stable  Last Vitals:  Vitals Value Taken Time  BP    Temp    Pulse 71 06/28/24 19:25  Resp 12 06/28/24 19:25  SpO2 100 % 06/28/24 19:25  Vitals shown include unfiled device data.  Last Pain:  Vitals:   06/28/24 1749  TempSrc:   PainSc: 0-No pain         Complications: No notable events documented.

## 2024-06-29 DIAGNOSIS — D069 Carcinoma in situ of cervix, unspecified: Secondary | ICD-10-CM | POA: Diagnosis present

## 2024-06-29 MED ORDER — IBUPROFEN 600 MG PO TABS: 600.0000 mg | ORAL_TABLET | Freq: Four times a day (QID) | ORAL | 0 refills | Status: AC

## 2024-06-29 NOTE — Discharge Summary (Signed)
 Postpartum Discharge Summary      Patient Name: Emily Poole DOB: 06-01-1996 MRN: [id number]  Date of admission: 06/27/2024 Delivery date: 06/28/2024 Delivering provider: Glenys GORMAN Birk, MD Date of discharge: 06/29/2024  Admitting diagnosis: IUFD at less than 20 weeks of gestation [O02.1] Intrauterine pregnancy: [redacted]w[redacted]d     Secondary diagnosis:  Principal Problem:   IUFD at less than 20 weeks of gestation  Additional problems: CIN 3, retained placenta    Discharge diagnosis: IUFD @ 19 weeks                                              Post partum procedures:curettage  Augmentation: Cytotec  Complications: None  Hospital course: Induction of Labor With Vaginal Delivery   28 y.o. yo G2P0101 at [redacted]w[redacted]d was admitted to the hospital 06/27/2024 for induction of labor.  Indication for induction: IUFD found at anatomy u/s.  Patient had an labor course complicated by none Delivery Method:SVD After 10 hours, pt. Taken to OR for curettage with no delivery of the placenta. Details of delivery can be found in separate delivery and operative notes.  Patient had a postpartum course complicated by none. Patient is discharged home 06/29/24.  Newborn Data: IUFD girl  Magnesium  Sulfate received: No BMZ received: No Rhophylac:N/A MMR:N/A T-DaP:n/a Flu: No RSV Vaccine received: No Transfusion:No  Immunizations received: Immunization History  Administered Date(s) Administered   Influenza,inj,Quad PF,6+ Mos 10/19/2014   Tdap 12/27/2014    Physical exam  Vitals:   06/28/24 2015 06/28/24 2027 06/28/24 2353 06/29/24 0405  BP: 115/80 117/76 (!) 105/54 (!) 119/57  Pulse: (!) 57 (!) 59 67 68  Resp: 13  16 16   Temp:  97.7 F (36.5 C) 97.9 F (36.6 C) 97.9 F (36.6 C)  TempSrc:  Oral Oral Oral  SpO2: 100% 100% 99% 100%  Weight:      Height:       General: alert, cooperative, and no distress Lochia: appropriate Uterine Fundus: firm DVT Evaluation: No evidence of DVT seen on physical  exam. Labs: Lab Results  Component Value Date   WBC 6.9 06/27/2024   HGB 11.1 (L) 06/27/2024   HCT 32.1 (L) 06/27/2024   MCV 87.9 06/27/2024   PLT 241 06/27/2024      Latest Ref Rng & Units 05/08/2024   11:15 AM  CMP  Glucose 70 - 99 mg/dL 79   BUN 6 - 20 mg/dL 5   Creatinine 9.42 - 8.99 mg/dL 9.39   Sodium 865 - 855 mmol/L 136   Potassium 3.5 - 5.2 mmol/L 4.2   Chloride 96 - 106 mmol/L 102   CO2 20 - 29 mmol/L 19   Calcium  8.7 - 10.2 mg/dL 9.4   Total Protein 6.0 - 8.5 g/dL 6.9   Total Bilirubin 0.0 - 1.2 mg/dL 0.4   Alkaline Phos 44 - 121 IU/L 64   AST 0 - 40 IU/L 19   ALT 0 - 32 IU/L 15    Edinburgh Score:     No data to display         No data recorded  After visit meds:  Allergies as of 06/29/2024   No Known Allergies      Medication List     STOP taking these medications    Doxylamine -Pyridoxine  10-10 MG Tbec Commonly known as: Diclegis    metoCLOPramide  10 MG tablet  Commonly known as: Reglan    scopolamine  1 MG/3DAYS Commonly known as: TRANSDERM-SCOP       TAKE these medications    albuterol  108 (90 Base) MCG/ACT inhaler Commonly known as: VENTOLIN  HFA Inhale 1-2 puffs into the lungs every 6 (six) hours as needed for wheezing or shortness of breath.   aspirin  81 MG chewable tablet Commonly known as: Aspirin  Childrens Chew 2 tablets (162 mg total) by mouth daily.   ibuprofen  600 MG tablet Commonly known as: ADVIL  Take 1 tablet (600 mg total) by mouth every 6 (six) hours.   PRENATAL PO Take 1 tablet by mouth daily.         Discharge home in stable condition  Infant Disposition:morgue Discharge instruction: per After Visit Summary and Postpartum booklet. Activity: Advance as tolerated. Pelvic rest for 6 weeks.  Diet: routine diet Future Appointments:No future appointments. Follow up Visit:  Follow-up Information     [facility] Follow up in 2 week(s).   Specialty:  Obstetrics and Gynecology Why: pp check, they will call you with an appointment Contact information: [address], [address] [place]  [address] [phone]                 Please schedule this patient for a In person postpartum visit in 2 weeks with the following provider: MD. Additional Postpartum F/U:Postpartum Depression checkup  High risk pregnancy complicated by: IUFD @ 19 weeks Delivery mode:  SVD Anticipated Birth Control:  Unsure   06/29/2024 Glenys GORMAN Birk, MD

## 2024-06-30 ENCOUNTER — Encounter (HOSPITAL_COMMUNITY): Payer: Self-pay | Admitting: Obstetrics and Gynecology

## 2024-07-01 LAB — SURGICAL PATHOLOGY

## 2024-07-02 LAB — SURGICAL PATHOLOGY

## 2024-07-03 ENCOUNTER — Encounter: Admitting: Obstetrics and Gynecology

## 2024-07-20 ENCOUNTER — Ambulatory Visit

## 2024-07-21 ENCOUNTER — Ambulatory Visit

## 2024-07-30 ENCOUNTER — Telehealth: Payer: Self-pay | Admitting: *Deleted

## 2024-07-30 NOTE — Telephone Encounter (Signed)
-----   Message from Glenys GORMAN Birk sent at 06/29/2024  7:42 AM EDT -----  Please schedule this patient for a In person postpartum visit in  2 weeks  with the following provider: MD. Additional Postpartum F/U:Postpartum Depression checkup  High risk pregnancy complicated by:  IUFD @ 19 weeks Delivery mode: SVD Anticipated Birth Control: Unsure

## 2024-07-30 NOTE — Telephone Encounter (Signed)
 Was leaving patient a message to call the office to schedule appointments but message disconnected.

## 2024-08-05 ENCOUNTER — Telehealth: Payer: Self-pay | Admitting: *Deleted

## 2024-08-05 ENCOUNTER — Encounter: Payer: Self-pay | Admitting: Sports Medicine

## 2024-08-05 NOTE — Telephone Encounter (Signed)
 Returned call from 07/31/2024 at 4:07 PM. Left patient a message to call and schedule.

## 2024-08-06 ENCOUNTER — Ambulatory Visit

## 2024-09-05 ENCOUNTER — Other Ambulatory Visit (HOSPITAL_COMMUNITY)
Admission: RE | Admit: 2024-09-05 | Discharge: 2024-09-05 | Disposition: A | Discharge status: Home / Self Care | Source: Ambulatory Visit | Attending: Certified Nurse Midwife | Admitting: Certified Nurse Midwife

## 2024-09-05 ENCOUNTER — Encounter: Payer: Self-pay | Admitting: Certified Nurse Midwife

## 2024-09-05 ENCOUNTER — Ambulatory Visit: Admitting: Certified Nurse Midwife

## 2024-09-05 ENCOUNTER — Other Ambulatory Visit: Payer: Self-pay | Admitting: Certified Nurse Midwife

## 2024-09-05 ENCOUNTER — Ambulatory Visit (INDEPENDENT_AMBULATORY_CARE_PROVIDER_SITE_OTHER): Admitting: Certified Nurse Midwife

## 2024-09-05 DIAGNOSIS — F53 Postpartum depression: Secondary | ICD-10-CM

## 2024-09-05 DIAGNOSIS — Z30017 Encounter for initial prescription of implantable subdermal contraceptive: Secondary | ICD-10-CM | POA: Diagnosis not present

## 2024-09-05 DIAGNOSIS — Z113 Encounter for screening for infections with a predominantly sexual mode of transmission: Secondary | ICD-10-CM

## 2024-09-05 DIAGNOSIS — Z3202 Encounter for pregnancy test, result negative: Secondary | ICD-10-CM

## 2024-09-05 LAB — POCT URINE PREGNANCY: Preg Test, Ur: NEGATIVE

## 2024-09-05 MED ORDER — ETONOGESTREL 68 MG ~~LOC~~ IMPL
68.0000 mg | DRUG_IMPLANT | Freq: Once | SUBCUTANEOUS | Status: AC
  Administered 2024-09-05: 68 mg via SUBCUTANEOUS

## 2024-09-05 NOTE — Progress Notes (Signed)
 Post Partum Visit Note  Emily Poole is a 28 y.o. G28P0101 female who presents for a postpartum visit. She is 9 weeks postpartum following a SVD and DNC on 06/28/24.  I have fully reviewed the prenatal and intrapartum course. The delivery was at 19 gestational weeks.  Anesthesia: paracervical block. Postpartum course has been .SABRA Bleeding no bleeding. Bowel function is normal. Bladder function is normal. Patient is sexually active. Contraception method is Nexplanon . Postpartum depression screening: question 17: hardly ever.   The pregnancy intention screening data noted above was reviewed. Potential methods of contraception were discussed. The patient elected to proceed with No data recorded.   Edinburgh Postnatal Depression Scale - 09/05/24 1027       Edinburgh Postnatal Depression Scale:  In the Past 7 Days   I have been able to laugh and see the funny side of things. 0    I have looked forward with enjoyment to things. 1    I have blamed myself unnecessarily when things went wrong. 2    I have been anxious or worried for no good reason. 2    I have felt scared or panicky for no good reason. 2    Things have been getting on top of me. 2    I have been so unhappy that I have had difficulty sleeping. 2    I have felt sad or miserable. 2    I have been so unhappy that I have been crying. 3    The thought of harming myself has occurred to me. 1    Edinburgh Postnatal Depression Scale Total 17          Health Maintenance Due  Topic Date Due   Hepatitis B Vaccines 19-59 Average Risk (1 of 3 - 19+ 3-dose series) Never done   HPV VACCINES (1 - 3-dose SCDM series) Never done   Influenza Vaccine  07/04/2024   COVID-19 Vaccine (1 - 2024-25 season) Never done    The following portions of the patient's history were reviewed and updated as appropriate: allergies, current medications, past family history, past medical history, past social history, past surgical history, and problem  list.  Review of Systems Pertinent items are noted in HPI.  Objective:  BP 120/76   Pulse 72   Ht 5' 5 (1.651 m)   Wt 163 lb (73.9 kg)   LMP 08/10/2023 (Approximate)   Breastfeeding No   BMI 27.12 kg/m    General:  alert, cooperative, and appears stated age   Breasts:  normal  Lungs: clear to auscultation bilaterally  Heart:  regular rate and rhythm, S1, S2 normal, no murmur, click, rub or gallop  Abdomen: soft, non-tender; bowel sounds normal; no masses,  no organomegaly   Wound NA  GU exam:  not indicated       Assessment:  1. Postpartum exam (Primary) - Benign PE - Cervicovaginal ancillary only - RPR+HBsAg+HCVAb+... - POCT urine pregnancy - etonogestrel  (NEXPLANON ) implant 68 mg  2. Screening examination for STD (sexually transmitted disease) - Cervicovaginal ancillary only - RPR+HBsAg+HCVAb+...  3. Encounter for initial prescription of Nexplanon  - POCT urine pregnancy - etonogestrel  (NEXPLANON ) implant 68 mg  Nexplanon  insertion Procedure Patient identified, informed consent performed, consent signed.   Appropriate time out taken. Nexplanon  site identified in patient's left arm.  Area prepped in usual sterile fashon. One ml of 1% lidocaine  was used to anesthetize the area at the distal end of the implant. A small stab incision was made  right beside the implant on the distal portion.  The Nexplanon  rod was grasped using hemostats and removed without difficulty. There was minimal blood loss. There were no complications. Steri-strips were applied over the small incision. A pressure bandage was applied to reduce any bruising. The patient tolerated the procedure well and was given post procedure instructions. Patient declines other forms of birth control.   4. Postpartum depression - Amb ref to Integrated Behavioral Health    Benign physical postpartum exam.   Plan:   Essential components of care per ACOG recommendations:  1.  Mood and well being: Patient with  positive depression screening today. Reviewed local resources for support.  - Patient tobacco use? No.   - hx of drug use? No.    2. Infant care and feeding: NA  3. Sexuality, contraception and birth spacing - Patient does not want a pregnancy in the next year.  Desired family size is 1 children.  - Reviewed reproductive life planning. Reviewed contraceptive methods based on pt preferences and effectiveness.  Patient desired Hormonal Implant today.   - Discussed birth spacing of 18 months  4. Sleep and fatigue -Encouraged family/partner/community support of 4 hrs of uninterrupted sleep to help with mood and fatigue  5. Physical Recovery  - Discussed patients delivery and complications. She describes her labor as mixed. - Patient had a Vaginal problems after delivery including retained placenta with curretage. Patient had a no laceration. Perineal healing reviewed. Patient expressed understanding - Patient has urinary incontinence? No. - Patient is safe to resume physical and sexual activity  6.  Health Maintenance - HM due items addressed Yes - Last pap smear  Diagnosis  Date Value Ref Range Status  05/08/2024   Final   - Negative for intraepithelial lesion or malignancy (NILM)   Pap smear not done at today's visit.  -Breast Cancer screening indicated? No.   7. Chronic Disease/Pregnancy Condition follow up: Abnormal pap smear.Schedule for colposcopy with MD.   - PCP follow up  Camie DELENA Rote, CNM [facility] for [facility], [facility]

## 2024-09-06 LAB — RPR+HBSAG+HCVAB+...
HIV Screen 4th Generation wRfx: NONREACTIVE
Hep C Virus Ab: NONREACTIVE
Hepatitis B Surface Ag: NEGATIVE
RPR Ser Ql: NONREACTIVE

## 2024-09-08 LAB — CERVICOVAGINAL ANCILLARY ONLY
Chlamydia: NEGATIVE
Comment: NEGATIVE
Comment: NEGATIVE
Comment: NORMAL
Neisseria Gonorrhea: NEGATIVE
Trichomonas: NEGATIVE

## 2024-09-18 ENCOUNTER — Telehealth: Payer: Self-pay

## 2024-09-18 ENCOUNTER — Other Ambulatory Visit: Payer: Self-pay

## 2024-09-18 DIAGNOSIS — O021 Missed abortion: Secondary | ICD-10-CM

## 2024-09-18 NOTE — Telephone Encounter (Signed)
 Patient called office to see if she could get a referral for Mental Health [facility]), please advise, thanks.

## 2024-10-02 ENCOUNTER — Encounter: Admitting: Obstetrics and Gynecology

## 2024-10-03 ENCOUNTER — Ambulatory Visit: Payer: Self-pay | Admitting: Licensed Clinical Social Worker

## 2024-10-03 DIAGNOSIS — F4321 Adjustment disorder with depressed mood: Secondary | ICD-10-CM

## 2024-10-03 NOTE — BH Specialist Note (Signed)
 Integrated Behavioral Health via Telemedicine Visit  10/07/2024 RAE PLOTNER [id number]  Number of Integrated Behavioral Health Clinician visits: 1- Initial Visit  Session Start time: 0815   Session End time: 0842  Total time in minutes: 27    Referring Provider: Dr. Erik Patient/Family location: Home [facility] Poole Provider location: Remote Office All persons participating in visit: Patient and Emily Poole Types of Service: Individual psychotherapy and Video visit  I connected with Emily Poole and/or Emily Poole's patient via  Telephone or Video Enabled Telemedicine Application  (Video is Caregility application) and verified that I am speaking with the correct person using two identifiers. Discussed confidentiality: Yes   I discussed the limitations of telemedicine and the availability of in person appointments.  Discussed there is a possibility of technology failure and discussed alternative modes of communication if that failure occurs.  I discussed that engaging in this telemedicine visit, they consent to the provision of behavioral healthcare and the services will be billed under their insurance.  Patient and/or legal guardian expressed understanding and consented to Telemedicine visit: Yes   Presenting Concerns: Patient and/or family reports the following symptoms/concerns: Depression/adjustment Duration of problem: Months; Severity of problem: moderate  Patient and/or Family's Strengths/Protective Factors: Social and Emotional competence, Concrete supports in place (healthy food, safe environments, etc.), and Physical Health (exercise, healthy diet, medication compliance, etc.)  Goals Addressed: Patient will:  Reduce symptoms of: depression   Increase knowledge and/or ability of: coping skills, healthy habits, and self-management skills   Demonstrate ability to: Increase healthy adjustment to current life circumstances and Increase adequate support systems for  patient/family  Progress towards Goals: Discontinued    Interventions: Interventions utilized:  Supportive Counseling, Psychoeducation and/or Health Education, Communication Skills, and Supportive Reflection Standardized Assessments completed: Not Needed    Patient and/or Family Response: Patient was present for today's virtual session and shared that she has been engaged in outpatient therapy every two weeks since 2020. She reported experiencing depressive symptoms related to becoming a mother at 27 and having her first child at 60 weeks while still completing school. Patient also disclosed that she recently experienced a stillbirth at 35 weeks on June 28, 2024, which has led to increased emotional distress and the use of alcohol  as a coping mechanism. She stated that she is actively addressing these concerns with her current therapist and does not feel that additional behavioral health sessions are needed at this time.    Clinical Assessment/Diagnosis  Adjustment disorder with depressed mood    Assessment: Patient currently experiencing increased depressive symptoms and emotional distress following a recent stillbirth. She reports using alcohol  to cope but is actively working through her grief and related challenges with her current therapist..   Patient may benefit from   Plan: Follow up with behavioral health clinician on : No follow up scheduled.  Behavioral recommendations: patient to continue engaging with her current therapist to process grief and depressive symptoms in a supportive environment. She should also work on developing healthier coping strategies to replace alcohol  use and seek additional support if symptoms worsen.SABRA Referral(s): Integrated [employer] (In Clinic)  I discussed the assessment and treatment plan with the patient and/or parent/guardian. They were provided an opportunity to ask questions and all were answered. They agreed with the plan and  demonstrated an understanding of the instructions.   They were advised to call back or seek an in-person evaluation if the symptoms worsen or if the condition fails to improve as anticipated.  Kharisma Glasner  LITTIE Seats, LCSWA

## 2024-10-07 ENCOUNTER — Telehealth: Payer: Self-pay | Admitting: *Deleted

## 2024-10-07 NOTE — Telephone Encounter (Signed)
 Left patient a message to call and reschedule [place].

## 2024-12-12 ENCOUNTER — Ambulatory Visit

## 2025-01-10 ENCOUNTER — Ambulatory Visit
